# Patient Record
Sex: Female | Born: 1935 | Race: Black or African American | Hispanic: No | Smoking: Never smoker
Health system: Southern US, Community
[De-identification: ages and names within clinical notes are randomized; demographics above are authoritative.]

## PROBLEM LIST (undated history)

## (undated) DIAGNOSIS — F319 Bipolar disorder, unspecified: Secondary | ICD-10-CM

## (undated) DIAGNOSIS — I10 Essential (primary) hypertension: Secondary | ICD-10-CM

## (undated) DIAGNOSIS — F0280 Dementia in other diseases classified elsewhere without behavioral disturbance: Secondary | ICD-10-CM

## (undated) DIAGNOSIS — F0390 Unspecified dementia without behavioral disturbance: Secondary | ICD-10-CM

## (undated) DIAGNOSIS — G309 Alzheimer's disease, unspecified: Secondary | ICD-10-CM

## (undated) HISTORY — PX: CHOLECYSTECTOMY: SHX55

## (undated) HISTORY — PX: ABDOMINAL HYSTERECTOMY: SHX81

---

## 2014-05-01 ENCOUNTER — Emergency Department (HOSPITAL_COMMUNITY): Payer: Medicare Other

## 2014-05-01 ENCOUNTER — Encounter (HOSPITAL_COMMUNITY): Payer: Self-pay | Admitting: Emergency Medicine

## 2014-05-01 ENCOUNTER — Emergency Department (HOSPITAL_COMMUNITY)
Admission: EM | Admit: 2014-05-01 | Discharge: 2014-05-02 | Disposition: A | Discharge status: Home / Self Care | Payer: Medicare Other | Attending: Emergency Medicine | Admitting: Emergency Medicine

## 2014-05-01 DIAGNOSIS — N390 Urinary tract infection, site not specified: Secondary | ICD-10-CM | POA: Insufficient documentation

## 2014-05-01 DIAGNOSIS — F03918 Unspecified dementia, unspecified severity, with other behavioral disturbance: Secondary | ICD-10-CM | POA: Insufficient documentation

## 2014-05-01 DIAGNOSIS — F0391 Unspecified dementia with behavioral disturbance: Secondary | ICD-10-CM | POA: Insufficient documentation

## 2014-05-01 LAB — CBC WITH DIFFERENTIAL/PLATELET
BASOS PCT: 0 % (ref 0–1)
Basophils Absolute: 0 10*3/uL (ref 0.0–0.1)
EOS ABS: 0.1 10*3/uL (ref 0.0–0.7)
EOS PCT: 1 % (ref 0–5)
HCT: 36.8 % (ref 36.0–46.0)
Hemoglobin: 12.1 g/dL (ref 12.0–15.0)
Lymphocytes Relative: 21 % (ref 12–46)
Lymphs Abs: 1.6 10*3/uL (ref 0.7–4.0)
MCH: 31.3 pg (ref 26.0–34.0)
MCHC: 32.9 g/dL (ref 30.0–36.0)
MCV: 95.3 fL (ref 78.0–100.0)
Monocytes Absolute: 1 10*3/uL (ref 0.1–1.0)
Monocytes Relative: 13 % — ABNORMAL HIGH (ref 3–12)
NEUTROS ABS: 5 10*3/uL (ref 1.7–7.7)
Neutrophils Relative %: 65 % (ref 43–77)
Platelets: 223 10*3/uL (ref 150–400)
RBC: 3.86 MIL/uL — ABNORMAL LOW (ref 3.87–5.11)
RDW: 14.4 % (ref 11.5–15.5)
WBC: 7.8 10*3/uL (ref 4.0–10.5)

## 2014-05-01 LAB — URINALYSIS, ROUTINE W REFLEX MICROSCOPIC
Bilirubin Urine: NEGATIVE
Glucose, UA: NEGATIVE mg/dL
Hgb urine dipstick: NEGATIVE
KETONES UR: NEGATIVE mg/dL
NITRITE: NEGATIVE
PH: 7 (ref 5.0–8.0)
Protein, ur: NEGATIVE mg/dL
Specific Gravity, Urine: 1.015 (ref 1.005–1.030)
Urobilinogen, UA: 0.2 mg/dL (ref 0.0–1.0)

## 2014-05-01 LAB — COMPREHENSIVE METABOLIC PANEL
ALK PHOS: 77 U/L (ref 39–117)
ALT: 10 U/L (ref 0–35)
AST: 20 U/L (ref 0–37)
Albumin: 3 g/dL — ABNORMAL LOW (ref 3.5–5.2)
BUN: 18 mg/dL (ref 6–23)
CO2: 28 mEq/L (ref 19–32)
Calcium: 9.2 mg/dL (ref 8.4–10.5)
Chloride: 99 mEq/L (ref 96–112)
Creatinine, Ser: 0.81 mg/dL (ref 0.50–1.10)
GFR calc non Af Amer: 64 mL/min — ABNORMAL LOW (ref >90)
GFR, EST AFRICAN AMERICAN: 75 mL/min — AB (ref >90)
Glucose, Bld: 153 mg/dL — ABNORMAL HIGH (ref 70–99)
Potassium: 4.3 mEq/L (ref 3.7–5.3)
Sodium: 140 mEq/L (ref 137–147)
TOTAL PROTEIN: 6.9 g/dL (ref 6.0–8.3)
Total Bilirubin: <0.2 mg/dL — ABNORMAL LOW (ref 0.3–1.2)

## 2014-05-01 LAB — URINE MICROSCOPIC-ADD ON

## 2014-05-01 LAB — APTT: APTT: 29 s (ref 24–37)

## 2014-05-01 MED ORDER — CEPHALEXIN 250 MG PO CAPS: 250.0000 mg | ORAL_CAPSULE | Freq: Four times a day (QID) | ORAL | Status: DC

## 2014-05-01 MED ORDER — DEXTROSE 5 % IV SOLN
1.0000 g | INTRAVENOUS | Status: DC
  Administered 2014-05-01: 1 g via INTRAVENOUS
  Filled 2014-05-01: qty 10

## 2014-05-01 NOTE — ED Notes (Signed)
Assisted pt with her clothes.  Waiting for [facility] for transport

## 2014-05-01 NOTE — ED Notes (Signed)
Bed: WA20 Expected date:  Expected time:  Means of arrival:  Comments: EMS 80yo F confusion

## 2014-05-01 NOTE — ED Notes (Signed)
Per EMS, [facility]. Nursing home staff state they could not wake pt up, after which they called EMS. EMS arrived and sternal rubbed pt, which awoke the pt. Nursing home staff state pt is more confused than normal. Pt has dementia. Pt is alert and oriented X 4.

## 2014-05-01 NOTE — ED Provider Notes (Signed)
CSN: [id number]     Arrival date & time 05/01/14  1937 History   First MD Initiated Contact with Patient 05/01/14 1939     Chief Complaint  Patient presents with  . Altered Mental Status    HPI Patient presents to the emergency room for evaluation of altered mental status at her nursing home. Nursing home staff went to assess the patient this evening and they were unable to get her to wake up. EMS was contacted. When EMS arrived they performed a brief sternal rub and the patient woke up. Nursing home staff also indicated the patient was more confused than usual today. She does have history of dementia. Here in the emergency room the patient denies having any complaints. She does remember the nursing home staff tried to wake her up. She tells me they were concerned about her so they sent her here to be evaluated. She is not having any trouble with any chest pain, headaches, abdominal pain, fevers, coughing or shortness of breath. Patient states she feels fine in her usual health.  History reviewed. No pertinent past medical history. History reviewed. No pertinent past surgical history. No family history on file. History  Substance Use Topics  . Smoking status: Never Smoker   . Smokeless tobacco: Not on file  . Alcohol Use: No   OB History   Grav Para Term Preterm Abortions TAB SAB Ect Mult Living                 Review of Systems  All other systems reviewed and are negative.     Allergies  Review of patient's allergies indicates no known allergies.  Home Medications   Prior to Admission medications   Not on File   BP 137/62  Pulse 68  Temp(Src) 98.4 F (36.9 C)  Resp 14  SpO2 99% Physical Exam  Nursing note and vitals reviewed. Constitutional: She appears well-developed and well-nourished. No distress.  HENT:  Head: Normocephalic and atraumatic.  Right Ear: External ear normal.  Left Ear: External ear normal.  Eyes: Conjunctivae are normal. Right eye exhibits no  discharge. Left eye exhibits no discharge. No scleral icterus.  Neck: Neck supple. No tracheal deviation present.  Cardiovascular: Normal rate, regular rhythm and intact distal pulses.   Pulmonary/Chest: Effort normal and breath sounds normal. No stridor. No respiratory distress. She has no wheezes. She has no rales.  Abdominal: Soft. Bowel sounds are normal. She exhibits no distension. There is no tenderness. There is no rebound and no guarding.  Musculoskeletal: She exhibits no edema and no tenderness.  Neurological: She is alert. She has normal strength. She is not disoriented. No cranial nerve deficit (no facial droop, extraocular movements intact, no slurred speech) or sensory deficit. She exhibits normal muscle tone. She displays no seizure activity. Coordination normal.  Oriented to person and place,   Skin: Skin is warm and dry. No rash noted.  Psychiatric: She has a normal mood and affect.    ED Course  Procedures (including critical care time) Labs Review Labs Reviewed  CBC WITH DIFFERENTIAL - Abnormal; Notable for the following:    RBC 3.86 (*)    Monocytes Relative 13 (*)    All other components within normal limits  COMPREHENSIVE METABOLIC PANEL - Abnormal; Notable for the following:    Glucose, Bld 153 (*)    Albumin 3.0 (*)    Total Bilirubin <0.2 (*)    GFR calc non Af Amer 64 (*)    GFR calc  Af Amer 75 (*)    All other components within normal limits  URINALYSIS, ROUTINE W REFLEX MICROSCOPIC - Abnormal; Notable for the following:    Leukocytes, UA MODERATE (*)    All other components within normal limits  APTT  URINE MICROSCOPIC-ADD ON    Imaging Review Ct Head Wo Contrast  05/01/2014   CLINICAL DATA:  Status post seizure.  Fall.  EXAM: CT HEAD WITHOUT CONTRAST  TECHNIQUE: Contiguous axial images were obtained from the base of the skull through the vertex without intravenous contrast.  COMPARISON:  None.  FINDINGS: There is chronic microvascular ischemic change.  Partially calcified mass in the posterior cranial fossa on the left measures 1.4 x 1.7 cm. The lesion cannot be definitively characterized but likely represents a meningioma. No other mass identified. There is no evidence of acute infarction, hemorrhage, midline shift or abnormal extra-axial fluid collection. No hydrocephalus or pneumocephalus. The calvarium is intact. Empty sella turcica is noted incidentally.  IMPRESSION: No acute abnormality.  Chronic microvascular ischemic change.  Calcified mass lesion the posterior cranial fossa cannot be definitively characterized but likely represents a meningioma.   Electronically Signed   By: Drusilla Kannerhomas  Dalessio M.D.   On: 05/01/2014 20:55     MDM   Final diagnoses:  UTI (urinary tract infection)    Pt remains in no distress in the ED.  UTI noted on UA.  Dose of abx given while in the ED.   Stable for discharge back to nursing home.  Keflex rx   Celene KrasJon R Jaydalynn Olivero, MD 05/01/14 2224

## 2014-05-01 NOTE — ED Notes (Signed)
[facility] CALLED  °

## 2014-05-01 NOTE — Discharge Instructions (Signed)
Urinary Tract Infection °A urinary tract infection (UTI) can occur any place along the urinary tract. The tract includes the kidneys, ureters, bladder, and urethra. A type of germ called bacteria often causes a UTI. UTIs are often helped with antibiotic medicine.  °HOME CARE  °· If given, take antibiotics as told by your doctor. Finish them even if you start to feel better. °· Drink enough fluids to keep your pee (urine) clear or pale yellow. °· Avoid tea, drinks with caffeine, and bubbly (carbonated) drinks. °· Pee often. Avoid holding your pee in for a long time. °· Pee before and after having sex (intercourse). °· Wipe from front to back after you poop (bowel movement) if you are a woman. Use each tissue only once. °GET HELP RIGHT AWAY IF:  °· You have back pain. °· You have lower belly (abdominal) pain. °· You have chills. °· You feel sick to your stomach (nauseous). °· You throw up (vomit). °· Your burning or discomfort with peeing does not go away. °· You have a fever. °· Your symptoms are not better in 3 days. °MAKE SURE YOU:  °· Understand these instructions. °· Will watch your condition. °· Will get help right away if you are not doing well or get worse. °Document Released: 05/24/2008 Document Revised: 08/30/2012 Document Reviewed: 07/06/2012 °ExitCare® Patient Information ©2014 [employer]. ° °

## 2014-05-01 NOTE — Progress Notes (Signed)
  CARE MANAGEMENT ED NOTE 05/01/2014  Patient:  Horatio PelGREEN,Fronnie   Account Number:  [id number]  Date Initiated:  05/01/2014  Documentation initiated by:  Radford PaxFERRERO,Mily Malecki  Subjective/Objective Assessment:   Patient presents to Ed with increased confusion and difficult to arouse.     Subjective/Objective Assessment Detail:     Action/Plan:   Action/Plan Detail:   Anticipated DC Date:  05/01/2014     Status Recommendation to Physician:   Result of Recommendation:    Other ED Services  Consult Working Plan    DC Planning Services  Other  PCP issues    Choice offered to / List presented to:            Status of service:  Completed, signed off  ED Comments:   ED Comments Detail:  As per patient's chart via EMS, [facility] is Dr. Danise MinaBarbara Thomas-Lowry.  System updated.

## 2014-05-01 NOTE — ED Notes (Signed)
[facility]. Staff state pt was not responsive for approximately 10 minutes.

## 2014-05-16 ENCOUNTER — Encounter (HOSPITAL_COMMUNITY): Payer: Self-pay | Admitting: Emergency Medicine

## 2014-05-16 ENCOUNTER — Emergency Department (HOSPITAL_COMMUNITY)
Admission: EM | Admit: 2014-05-16 | Discharge: 2014-05-17 | Disposition: A | Discharge status: Other Acute Inpatient Hospital | Payer: Medicare Other | Attending: Emergency Medicine | Admitting: Emergency Medicine

## 2014-05-16 DIAGNOSIS — F0280 Dementia in other diseases classified elsewhere without behavioral disturbance: Secondary | ICD-10-CM | POA: Diagnosis not present

## 2014-05-16 DIAGNOSIS — Z79899 Other long term (current) drug therapy: Secondary | ICD-10-CM | POA: Diagnosis not present

## 2014-05-16 DIAGNOSIS — G309 Alzheimer's disease, unspecified: Secondary | ICD-10-CM | POA: Diagnosis not present

## 2014-05-16 DIAGNOSIS — F411 Generalized anxiety disorder: Secondary | ICD-10-CM | POA: Insufficient documentation

## 2014-05-16 DIAGNOSIS — F3010 Manic episode without psychotic symptoms, unspecified: Secondary | ICD-10-CM

## 2014-05-16 DIAGNOSIS — F309 Manic episode, unspecified: Secondary | ICD-10-CM | POA: Diagnosis not present

## 2014-05-16 DIAGNOSIS — I10 Essential (primary) hypertension: Secondary | ICD-10-CM | POA: Insufficient documentation

## 2014-05-16 DIAGNOSIS — IMO0002 Reserved for concepts with insufficient information to code with codable children: Secondary | ICD-10-CM | POA: Insufficient documentation

## 2014-05-16 DIAGNOSIS — F319 Bipolar disorder, unspecified: Secondary | ICD-10-CM

## 2014-05-16 DIAGNOSIS — F909 Attention-deficit hyperactivity disorder, unspecified type: Secondary | ICD-10-CM | POA: Insufficient documentation

## 2014-05-16 DIAGNOSIS — R4182 Altered mental status, unspecified: Secondary | ICD-10-CM | POA: Diagnosis present

## 2014-05-16 DIAGNOSIS — Z792 Long term (current) use of antibiotics: Secondary | ICD-10-CM | POA: Diagnosis not present

## 2014-05-16 HISTORY — DX: Unspecified dementia, unspecified severity, without behavioral disturbance, psychotic disturbance, mood disturbance, and anxiety: F03.90

## 2014-05-16 HISTORY — DX: Dementia in other diseases classified elsewhere, unspecified severity, without behavioral disturbance, psychotic disturbance, mood disturbance, and anxiety: F02.80

## 2014-05-16 HISTORY — DX: Bipolar disorder, unspecified: F31.9

## 2014-05-16 HISTORY — DX: Alzheimer's disease, unspecified: G30.9

## 2014-05-16 HISTORY — DX: Essential (primary) hypertension: I10

## 2014-05-16 LAB — RAPID URINE DRUG SCREEN, HOSP PERFORMED
AMPHETAMINES: NOT DETECTED
BENZODIAZEPINES: NOT DETECTED
Barbiturates: NOT DETECTED
COCAINE: NOT DETECTED
OPIATES: NOT DETECTED
Tetrahydrocannabinol: NOT DETECTED

## 2014-05-16 LAB — COMPREHENSIVE METABOLIC PANEL
ALBUMIN: 3.3 g/dL — AB (ref 3.5–5.2)
ALK PHOS: 66 U/L (ref 39–117)
ALT: 14 U/L (ref 0–35)
AST: 20 U/L (ref 0–37)
BUN: 16 mg/dL (ref 6–23)
CO2: 24 mEq/L (ref 19–32)
Calcium: 9.1 mg/dL (ref 8.4–10.5)
Chloride: 108 mEq/L (ref 96–112)
Creatinine, Ser: 1.23 mg/dL — ABNORMAL HIGH (ref 0.50–1.10)
GFR calc Af Amer: 45 mL/min — ABNORMAL LOW (ref >90)
GFR calc non Af Amer: 39 mL/min — ABNORMAL LOW (ref >90)
Glucose, Bld: 142 mg/dL — ABNORMAL HIGH (ref 70–99)
POTASSIUM: 3.6 meq/L — AB (ref 3.7–5.3)
Sodium: 145 mEq/L (ref 137–147)
TOTAL PROTEIN: 6.4 g/dL (ref 6.0–8.3)
Total Bilirubin: <0.2 mg/dL — ABNORMAL LOW (ref 0.3–1.2)

## 2014-05-16 LAB — VALPROIC ACID LEVEL: Valproic Acid Lvl: <10 ug/mL — ABNORMAL LOW (ref 50.0–100.0)

## 2014-05-16 LAB — URINALYSIS, ROUTINE W REFLEX MICROSCOPIC
GLUCOSE, UA: NEGATIVE mg/dL
HGB URINE DIPSTICK: NEGATIVE
Ketones, ur: NEGATIVE mg/dL
Leukocytes, UA: NEGATIVE
Nitrite: NEGATIVE
PH: 5 (ref 5.0–8.0)
Protein, ur: 30 mg/dL — AB
Specific Gravity, Urine: 1.021 (ref 1.005–1.030)
Urobilinogen, UA: 0.2 mg/dL (ref 0.0–1.0)

## 2014-05-16 LAB — CBC
HEMATOCRIT: 34.8 % — AB (ref 36.0–46.0)
Hemoglobin: 11.4 g/dL — ABNORMAL LOW (ref 12.0–15.0)
MCH: 31.4 pg (ref 26.0–34.0)
MCHC: 32.8 g/dL (ref 30.0–36.0)
MCV: 95.9 fL (ref 78.0–100.0)
PLATELETS: 274 10*3/uL (ref 150–400)
RBC: 3.63 MIL/uL — ABNORMAL LOW (ref 3.87–5.11)
RDW: 14.9 % (ref 11.5–15.5)
WBC: 6.3 10*3/uL (ref 4.0–10.5)

## 2014-05-16 LAB — URINE MICROSCOPIC-ADD ON

## 2014-05-16 LAB — CBG MONITORING, ED: Glucose-Capillary: 131 mg/dL — ABNORMAL HIGH (ref 70–99)

## 2014-05-16 LAB — ETHANOL: Alcohol, Ethyl (B): <11 mg/dL (ref 0–11)

## 2014-05-16 MED ORDER — MEMANTINE HCL ER 7 MG PO CP24
28.0000 mg | ORAL_CAPSULE | Freq: Every day | ORAL | Status: DC
  Filled 2014-05-16: qty 4

## 2014-05-16 MED ORDER — AMLODIPINE BESYLATE 10 MG PO TABS
10.0000 mg | ORAL_TABLET | Freq: Every day | ORAL | Status: DC
  Administered 2014-05-17: 10 mg via ORAL
  Filled 2014-05-16: qty 1

## 2014-05-16 MED ORDER — LORAZEPAM 2 MG/ML IJ SOLN: 1.0000 mg | INTRAMUSCULAR | Status: DC | PRN

## 2014-05-16 MED ORDER — LORAZEPAM 0.5 MG PO TABS
0.2500 mg | ORAL_TABLET | Freq: Two times a day (BID) | ORAL | Status: DC | PRN
  Administered 2014-05-17: 0.25 mg via ORAL
  Filled 2014-05-16: qty 1

## 2014-05-16 MED ORDER — QUETIAPINE FUMARATE 100 MG PO TABS: 100.0000 mg | ORAL_TABLET | Freq: Every day | ORAL | Status: DC

## 2014-05-16 MED ORDER — METOPROLOL TARTRATE 25 MG PO TABS
25.0000 mg | ORAL_TABLET | Freq: Every day | ORAL | Status: DC
  Administered 2014-05-17: 25 mg via ORAL
  Filled 2014-05-16: qty 1

## 2014-05-16 MED ORDER — VENLAFAXINE HCL 37.5 MG PO TABS
37.5000 mg | ORAL_TABLET | ORAL | Status: DC
  Administered 2014-05-17 (×2): 37.5 mg via ORAL
  Filled 2014-05-16 (×4): qty 1

## 2014-05-16 MED ORDER — HALOPERIDOL LACTATE 5 MG/ML IJ SOLN
5.0000 mg | Freq: Once | INTRAMUSCULAR | Status: AC
  Administered 2014-05-16: 5 mg via INTRAMUSCULAR
  Filled 2014-05-16: qty 1

## 2014-05-16 MED ORDER — HYDROCHLOROTHIAZIDE 25 MG PO TABS
25.0000 mg | ORAL_TABLET | Freq: Every day | ORAL | Status: DC
  Administered 2014-05-17: 25 mg via ORAL
  Filled 2014-05-16: qty 1

## 2014-05-16 NOTE — ED Notes (Signed)
Per MAR from SNF-pt has been refusing to take all medications

## 2014-05-16 NOTE — ED Notes (Signed)
Pt is here for medical clearance

## 2014-05-16 NOTE — ED Notes (Signed)
Dr. James at bedside  
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
## 2014-05-16 NOTE — ED Notes (Signed)
Notified RN, Merle pt. CBG 131.

## 2014-05-17 ENCOUNTER — Emergency Department (HOSPITAL_COMMUNITY): Payer: Medicare Other

## 2014-05-17 DIAGNOSIS — F309 Manic episode, unspecified: Secondary | ICD-10-CM | POA: Diagnosis not present

## 2014-05-17 DIAGNOSIS — F29 Unspecified psychosis not due to a substance or known physiological condition: Secondary | ICD-10-CM

## 2014-05-17 MED ORDER — HALOPERIDOL LACTATE 5 MG/ML IJ SOLN
5.0000 mg | Freq: Once | INTRAMUSCULAR | Status: AC
  Administered 2014-05-17: 5 mg via INTRAMUSCULAR
  Filled 2014-05-17: qty 1

## 2014-05-17 MED ORDER — DIVALPROEX SODIUM 125 MG PO CPSP
250.0000 mg | ORAL_CAPSULE | Freq: Four times a day (QID) | ORAL | Status: DC
  Administered 2014-05-17 (×2): 250 mg via ORAL
  Filled 2014-05-17 (×4): qty 2

## 2014-05-17 NOTE — ED Notes (Signed)
Report received from [place], [place]  Pt has been accepted at [facility] and will be transported tomorrow.

## 2014-05-17 NOTE — Progress Notes (Signed)
Referred to:   [facility] pending review.   At [facility], [place] [phone]  ED CSW 05/17/2014 1446pm

## 2014-05-17 NOTE — ED Notes (Signed)
Pt took pill with applesauce.
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
## 2014-05-17 NOTE — Consult Note (Signed)
Jenny Patterson Face-to-Face Psychiatry Consult   Reason for Consult:  Psychosis NOS Referring Physician:  EDP  Jenny Patterson is an 80 y.o. female. Total Time spent with patient: 20 minutes  Assessment: AXIS I:  Psychotic Disorder NOS AXIS II:  Deferred AXIS III:   Past Medical History  Diagnosis Date  . Alzheimer disease   . Dementia   . Bipolar 1 disorder   . Hypertension    AXIS IV:  other psychosocial or environmental problems, problems related to social environment and problems with primary support group AXIS V:  11-20 some danger of hurting self or others possible OR occasionally fails to maintain minimal personal hygiene OR gross impairment in communication  Plan:  Recommend psychiatric Inpatient admission when medically cleared.  Subjective:   Jenny Patterson is a 80 y.o. female patient admitted with change in her mental status.Marland Kitchen  HPI:  Patient seen and chart reviewed.  Patient is 80 year old female who is brought in by [facility] because she was manic, loud disruptive and threatening to kill herself and other people.  She was refusing to take medication.  Patient was given Haldol in the emergency room.  Patient remains very disorganized, manic, irritable and disruptive.  Her speech is very fast and incoherent.  She continued to endorse that people does not like her and she appears paranoid.  She was upset because she was unable to see the daughter.  She is in a denial that she has any psychiatric problem.  Patient is unable to provide any relevant information.  As per chart she is taking Depakote however her level is low.  Patient appears any manic.  She is on Seroquel and venlafaxine.  She denies any hallucination however she appears very paranoid, illogical, irrational and disruptive.  Patient requires inpatient psychiatric treatment.   Past Psychiatric History: Past Medical History  Diagnosis Date  . Alzheimer disease   . Dementia   . Bipolar 1 disorder   . Hypertension     reports  that she has never smoked. She does not have any smokeless tobacco history on file. She reports that she does not drink alcohol or use illicit drugs. No family history on file. Family History Substance Abuse: No Family Supports: Yes, List: (Daughter in [place].  Although she is sick.) Living Arrangements: Other (Comment) (Assisted living facility, [place]) Can pt return to current living arrangement?:  (Unsure.  Could not get ayone at the number.) Abuse/Neglect [facility]) Physical Abuse: Denies Verbal Abuse: Denies Sexual Abuse: Denies Allergies:  No Known Allergies  ACT Assessment Complete:  Yes:    Educational Status    Risk to Self: Risk to self Suicidal Ideation: No Suicidal Intent: No Is patient at risk for suicide?: No Suicidal Plan?: No Access to Means: No What has been your use of drugs/alcohol within the last 12 months?: Denies use Previous Attempts/Gestures: No How many times?: 0 Other Self Harm Risks: None Triggers for Past Attempts: None known Intentional Self Injurious Behavior: None Family Suicide History: Unknown Recent stressful life event(s): Other (Comment) (Pt did not identify a specific thing.) Persecutory voices/beliefs?: Yes Depression: No Depression Symptoms:  (Denies depressive symptoms) Substance abuse history and/or treatment for substance abuse?: No Suicide prevention information given to non-admitted patients: Not applicable  Risk to Others: Risk to Others Homicidal Ideation: No Thoughts of Harm to Others: No Current Homicidal Intent: No Current Homicidal Plan: No Access to Homicidal Means: No Identified Victim: No one History of harm to others?: No Assessment of Violence:  (Resportedly  physically aggressive at facility.  Pt denies.) Violent Behavior Description: Pt denies this behavior. Does patient have access to weapons?: No Criminal Charges Pending?: No Does patient have a court date: No  Abuse: Abuse/Neglect Assessment  (Assessment to be complete while patient is alone) Physical Abuse: Denies Verbal Abuse: Denies Sexual Abuse: Denies Exploitation of patient/patient's resources: Denies Self-Neglect: Denies  Prior Inpatient Therapy: Prior Inpatient Therapy Prior Inpatient Therapy: Yes Prior Therapy Dates: Unknown Prior Therapy Facilty/Provider(s): Several commitmnts in past Reason for Treatment: Mania  Prior Outpatient Therapy: Prior Outpatient Therapy Prior Outpatient Therapy: No Prior Therapy Dates: N/a Prior Therapy Facilty/Provider(s): N/A Reason for Treatment: N/A  Additional Information: Additional Information 1:1 In Past 12 Months?: No CIRT Risk: No Elopement Risk: No Does patient have medical clearance?: Yes                  Objective: Blood pressure 166/79, pulse 66, temperature 97.4 F (36.3 C), temperature source Oral, resp. rate 16, SpO2 98.00%.There is no height or weight on file to calculate BMI. Results for orders placed during the Patterson encounter of 05/16/14 (from the past 72 hour(s))  CBC     Status: Abnormal   Collection Time    05/16/14  8:25 PM      Result Value Ref Range   WBC 6.3  4.0 - 10.5 K/uL   RBC 3.63 (*) 3.87 - 5.11 MIL/uL   Hemoglobin 11.4 (*) 12.0 - 15.0 g/dL   HCT 34.8 (*) 36.0 - 46.0 %   MCV 95.9  78.0 - 100.0 fL   MCH 31.4  26.0 - 34.0 pg   MCHC 32.8  30.0 - 36.0 g/dL   RDW 14.9  11.5 - 15.5 %   Platelets 274  150 - 400 K/uL  COMPREHENSIVE METABOLIC PANEL     Status: Abnormal   Collection Time    05/16/14  8:25 PM      Result Value Ref Range   Sodium 145  137 - 147 mEq/L   Potassium 3.6 (*) 3.7 - 5.3 mEq/L   Chloride 108  96 - 112 mEq/L   CO2 24  19 - 32 mEq/L   Glucose, Bld 142 (*) 70 - 99 mg/dL   BUN 16  6 - 23 mg/dL   Creatinine, Ser 1.23 (*) 0.50 - 1.10 mg/dL   Calcium 9.1  8.4 - 10.5 mg/dL   Total Protein 6.4  6.0 - 8.3 g/dL   Albumin 3.3 (*) 3.5 - 5.2 g/dL   AST 20  0 - 37 U/L   ALT 14  0 - 35 U/L   Alkaline Phosphatase 66   39 - 117 U/L   Total Bilirubin <0.2 (*) 0.3 - 1.2 mg/dL   GFR calc non Af Amer 39 (*) >90 mL/min   GFR calc Af Amer 45 (*) >90 mL/min   Comment: (NOTE)     The eGFR has been calculated using the CKD EPI equation.     This calculation has not been validated in all clinical situations.     eGFR's persistently <90 mL/min signify possible Chronic Kidney     Disease.  ETHANOL     Status: None   Collection Time    05/16/14  8:25 PM      Result Value Ref Range   Alcohol, Ethyl (B) <11  0 - 11 mg/dL   Comment:            LOWEST DETECTABLE LIMIT FOR     SERUM ALCOHOL IS 11  mg/dL     FOR MEDICAL PURPOSES ONLY  CBG MONITORING, ED     Status: Abnormal   Collection Time    05/16/14  8:30 PM      Result Value Ref Range   Glucose-Capillary 131 (*) 70 - 99 mg/dL  URINALYSIS, ROUTINE W REFLEX MICROSCOPIC     Status: Abnormal   Collection Time    05/16/14  8:48 PM      Result Value Ref Range   Color, Urine YELLOW  YELLOW   APPearance CLOUDY (*) CLEAR   Specific Gravity, Urine 1.021  1.005 - 1.030   pH 5.0  5.0 - 8.0   Glucose, UA NEGATIVE  NEGATIVE mg/dL   Hgb urine dipstick NEGATIVE  NEGATIVE   Bilirubin Urine SMALL (*) NEGATIVE   Ketones, ur NEGATIVE  NEGATIVE mg/dL   Protein, ur 30 (*) NEGATIVE mg/dL   Urobilinogen, UA 0.2  0.0 - 1.0 mg/dL   Nitrite NEGATIVE  NEGATIVE   Leukocytes, UA NEGATIVE  NEGATIVE  URINE RAPID DRUG SCREEN (HOSP PERFORMED)     Status: None   Collection Time    05/16/14  8:48 PM      Result Value Ref Range   Opiates NONE DETECTED  NONE DETECTED   Cocaine NONE DETECTED  NONE DETECTED   Benzodiazepines NONE DETECTED  NONE DETECTED   Amphetamines NONE DETECTED  NONE DETECTED   Tetrahydrocannabinol NONE DETECTED  NONE DETECTED   Barbiturates NONE DETECTED  NONE DETECTED   Comment:            DRUG SCREEN FOR MEDICAL PURPOSES     ONLY.  IF CONFIRMATION IS NEEDED     FOR ANY PURPOSE, NOTIFY LAB     WITHIN 5 DAYS.                LOWEST DETECTABLE LIMITS     FOR  URINE DRUG SCREEN     Drug Class       Cutoff (ng/mL)     Amphetamine      1000     Barbiturate      200     Benzodiazepine   413     Tricyclics       244     Opiates          300     Cocaine          300     THC              50  URINE MICROSCOPIC-ADD ON     Status: Abnormal   Collection Time    05/16/14  8:48 PM      Result Value Ref Range   Squamous Epithelial / LPF RARE  RARE   WBC, UA 0-2  <3 WBC/hpf   Casts HYALINE CASTS (*) NEGATIVE   Urine-Other AMORPHOUS URATES/PHOSPHATES     Comment: MUCOUS PRESENT  VALPROIC ACID LEVEL     Status: Abnormal   Collection Time    05/16/14  8:51 PM      Result Value Ref Range   Valproic Acid Lvl <10.0 (*) 50.0 - 100.0 ug/mL   Comment: Performed at [facility] are reviewed.  Current Facility-Administered Medications  Medication Dose Route Frequency Provider Last Rate Last Dose  . amLODipine (NORVASC) tablet 10 mg  10 mg Oral Daily Tanna Furry, MD   10 mg at 05/17/14 1002  . divalproex (DEPAKOTE SPRINKLE) capsule 250 mg  250 mg Oral QID  Kristeen Mans, NP   250 mg at 05/17/14 1004  . hydrochlorothiazide (HYDRODIURIL) tablet 25 mg  25 mg Oral Daily Rolland Porter, MD   25 mg at 05/17/14 1001  . LORazepam (ATIVAN) injection 1 mg  1 mg Intramuscular Q4H PRN Rolland Porter, MD      . LORazepam (ATIVAN) tablet 0.25 mg  0.25 mg Oral Q12H PRN Rolland Porter, MD      . Memantine HCl ER CP24 28 mg  28 mg Oral Daily Rolland Porter, MD      . metoprolol tartrate (LOPRESSOR) tablet 25 mg  25 mg Oral Daily Rolland Porter, MD   25 mg at 05/17/14 1001  . QUEtiapine (SEROQUEL) tablet 100 mg  100 mg Oral QHS Rolland Porter, MD      . venlafaxine [facility]) tablet 37.5 mg  37.5 mg Oral 3 times per day Rolland Porter, MD   37.5 mg at 05/17/14 7510   Current Outpatient Prescriptions  Medication Sig Dispense Refill  . amLODipine (NORVASC) 10 MG tablet Take 10 mg by mouth daily.      . divalproex (DEPAKOTE SPRINKLE) 125 MG capsule Take 250 mg by mouth 4 (four) times daily.       Marland Kitchen guaifenesin (ROBITUSSIN) 100 MG/5ML syrup Take 200 mg by mouth every 6 (six) hours as needed for cough.      . hydrochlorothiazide (HYDRODIURIL) 25 MG tablet Take 25 mg by mouth daily.      Marland Kitchen loperamide (IMODIUM) 2 MG capsule Take 2 mg by mouth as needed for diarrhea or loose stools.      Marland Kitchen LORazepam (ATIVAN) 0.5 MG tablet Take 0.25 mg by mouth every 12 (twelve) hours as needed for anxiety.      . magnesium hydroxide (MILK OF MAGNESIA) 400 MG/5ML suspension Take 30 mLs by mouth at bedtime as needed for mild constipation.      . Memantine HCl ER (NAMENDA XR) 28 MG CP24 Take 28 mg by mouth daily.      . metoprolol tartrate (LOPRESSOR) 25 MG tablet Take 25 mg by mouth daily.      . QUEtiapine (SEROQUEL) 100 MG tablet Take 100 mg by mouth at bedtime.      Marland Kitchen venlafaxine (EFFEXOR) 37.5 MG tablet Take 37.5 mg by mouth See admin instructions. With breakfast, supper and at bedtime      . cephALEXin (KEFLEX) 250 MG capsule Take 1 capsule (250 mg total) by mouth 4 (four) times daily.  28 capsule  0  . ciprofloxacin (CIPRO) 500 MG tablet Take 500 mg by mouth every 12 (twelve) hours.        Psychiatric Specialty Exam:     Blood pressure 166/79, pulse 66, temperature 97.4 F (36.3 C), temperature source Oral, resp. rate 16, SpO2 98.00%.There is no height or weight on file to calculate BMI.  General Appearance: Guarded  Eye Contact::  Poor  Speech:  Pressured and Fast  Volume:  Increased  Mood:  Irritable  Affect:  Inappropriate and Labile  Thought Process:  Disorganized  Orientation:  Other:  Confused  Thought Content:  Delusions and Paranoid Ideation  Suicidal Thoughts:  No  Homicidal Thoughts:  Yes.  without intent/plan patient threatened to hit other people.    Memory:  poor  Judgement:  Impaired  Insight:  Shallow  Psychomotor Activity:  Increased and Restlessness  Concentration:  Poor  Recall:  Poor  Fund of Knowledge:Fair  Language: Fair  Akathisia:  No  Handed:  Right  AIMS (if  indicated):     Assets:  Housing  Sleep:      Musculoskeletal: Strength & Muscle Tone: Unable to assess Gait & Station: Patient is lying on the bed Patient leans: N/A  Treatment Plan Summary: The patient requires inpatient psychiatric treatment., she does require Gero-Psychiatric bed.  Continue current medication until we find the bed and if she is medically cleared transfer to psychiatric hospitalization.  Dossie Der T Anayeli Arel 05/17/2014 11:21 AM

## 2014-05-17 NOTE — Progress Notes (Addendum)
Patient requested that CSW contact a Sherlynn Stalls [phone] she said was her doctor.  CSW called the number and it was a Engineer, technical sales for [employer].  CSW informed the patient of the contact.      Maryelizabeth Rowan, MSW, [place], 05/17/2014 Evening Clinical Social Worker [phone]

## 2014-05-17 NOTE — ED Notes (Signed)
Deputy has arrived to transport pt to [facility]. Zigmund Daniel at [place] notified

## 2014-05-17 NOTE — Progress Notes (Signed)
Pt recommended for inpatient treatment.   Byrd Hesselbach [phone]  ED CSW 05/17/2014 1110am

## 2014-05-17 NOTE — ED Notes (Signed)
Pt has white jogging pants,  White shirt,  Purple cotton jacket ,  Black shoes ,  White socks.   In a belongings bag

## 2014-05-17 NOTE — ED Notes (Signed)
Pt was pacing floor and stating she wanted her clothes and demanding that she get her things from the facility she is at.  Pt redirected and guided gently back to room with sitter at bedside

## 2014-05-17 NOTE — ED Notes (Signed)
Report called to [facility], per Social Work, Deputy has been contacted for transport

## 2014-05-17 NOTE — Progress Notes (Signed)
CSW faxed all labs and IVC paperwork requested by Delorise Shiner at [facility].  She said she will call back once the information is received and give the accepting physician.     Maryelizabeth Rowan, MSW, [place], 05/17/2014 Evening Clinical Social Worker [phone]

## 2014-05-17 NOTE — ED Notes (Signed)
TTS at bedside. 

## 2014-05-17 NOTE — ED Notes (Signed)
Spoke with dispatch, they have no record of call for transport. Transport requested, deputy to respond asap.

## 2014-05-17 NOTE — ED Notes (Signed)
Pt ambulated to bathroom with minimal assistance from [place] NT  Pt is alert and oriented to self and place,  Pt has been cooperative with staff

## 2014-05-17 NOTE — ED Notes (Signed)
Per [place] RN  Pt refused her depakote due to fact she said it didn;t look like her past medication, this writer will try again shortly to give medication

## 2014-05-17 NOTE — ED Notes (Signed)
Per [facility], they do not do transports at night, pt will be transported in the AM Pt now walking around room, very agitated about her belongings at [facility]. Pt will have safety sitter at 2300

## 2014-05-17 NOTE — Progress Notes (Signed)
CSW received a call from [place] with [facility] accepting the patient per Dr. Baldo Daub. Report#[phone]. CSW informed the nursing staff of the updated disposition and provided all information to complete this transfer.      Maryelizabeth Rowan, MSW, [place], 05/17/2014 Evening Clinical Social Worker [phone]

## 2014-05-17 NOTE — BH Assessment (Signed)
Assessment Note  Jenny Patterson is an 80 y.o. female.  -Clinician reviewed the note by Dr. Fayrene Fearing.  Patient was IVC'ed by Child psychotherapist at [facility].  Reportedly has been manic, loud & disruptive.  Had threatened to kill herself if she has to go to a doctor or takes meds.  Facility has recorded a lot of medication refusals.  Patient was given haldol a few hours before this clinician saw her.  Patient did arouse easily.  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  She talks about how she prays with other residents at [facility].  Patient does deny SI, HI or A/V hallucinations.  Patient says that she does not need other medications besides her UTI medications.  She reports that the other medications made her feel sick and that she stopped taking them because of this.  She says that when she stopped, she got to feeling better.  Patient questioned why she needed to be seen by the psychiatrist later in the AM.  She says, "there is nothing wrong with me mentally."  Patient has a 52 year old daughter who has cancer and lives in [place].  Patient would like to be able to visit this daughter.  -Clinician did try to call [facility] but did not get anyone in their office.  Clinician talked with Alberteen Sam, NP who recommended that patient be seen by psychiatry in AM on 05/29 to uphold or rescind IVC.  Clinician talked with Dr. Ranae Palms, who agreed with this.  First opinion has not been completed, pending psychiatric review of IVC.  Axis I: Bipolar, Manic Axis II: Deferred Axis III:  Past Medical History  Diagnosis Date  . Alzheimer disease   . Dementia   . Bipolar 1 disorder   . Hypertension    Axis IV: other psychosocial or environmental problems Axis V: 31-40 impairment in reality testing  Past Medical History:  Past Medical History  Diagnosis Date  . Alzheimer disease   . Dementia   . Bipolar 1  disorder   . Hypertension     Past Surgical History  Procedure Laterality Date  . Cholecystectomy    . Abdominal hysterectomy      partial    Family History: No family history on file.  Social History:  reports that she has never smoked. She does not have any smokeless tobacco history on file. She reports that she does not drink alcohol or use illicit drugs.  Additional Social History:  Alcohol / Drug Use Pain Medications: See PTA medications list Prescriptions: See PTA medications list Over the Counter: See PTA medications list History of alcohol / drug use?: No history of alcohol / drug abuse  CIWA: CIWA-Ar BP: 144/84 mmHg Pulse Rate: 74 COWS:    Allergies: No Known Allergies  Home Medications:  (Not in a hospital admission)  OB/GYN Status:  No LMP recorded. Patient is postmenopausal.  General Assessment Data Location of Assessment: [facility] ED Is this a Tele or Face-to-Face Assessment?: Face-to-Face Is this an Initial Assessment or a Re-assessment for this encounter?: Initial Assessment Living Arrangements: Other (Comment) (Assisted living facility, [facility]) Can pt return to current living arrangement?:  (Unsure.  Could not get ayone at the number.) Admission Status: Involuntary Is patient capable of signing voluntary admission?: No Transfer from: Acute Hospital Referral Source: Other (Social worker at nursing facility took out [employer].)  [facility] Living Arrangements: Other (Comment) ([facility] Name of Psychiatrist: None Name of Therapist: None     Risk to self Suicidal Ideation: No Suicidal Intent: No Is patient at risk for suicide?: No Suicidal Plan?: No Access to Means: No What has been your use of drugs/alcohol within the last 12 months?: Denies use Previous Attempts/Gestures: No How many times?: 0 Other Self Harm Risks: None Triggers for Past Attempts: None known Intentional Self Injurious  Behavior: None Family Suicide History: Unknown Recent stressful life event(s): Other (Comment) (Pt did not identify a specific thing.) Persecutory voices/beliefs?: Yes Depression: No Depression Symptoms:  (Denies depressive symptoms) Substance abuse history and/or treatment for substance abuse?: No Suicide prevention information given to non-admitted patients: Not applicable  Risk to Others Homicidal Ideation: No Thoughts of Harm to Others: No Current Homicidal Intent: No Current Homicidal Plan: No Access to Homicidal Means: No Identified Victim: No one History of harm to others?: No Assessment of Violence:  (Resportedly physically aggressive at facility.  Pt denies.) Violent Behavior Description: Pt denies this behavior. Does patient have access to weapons?: No Criminal Charges Pending?: No Does patient have a court date: No  Psychosis Hallucinations: None noted Delusions: None noted  Mental Status Report Appear/Hygiene: Disheveled Eye Contact: Good Motor Activity: Freedom of movement;Unsteady Speech: Logical/coherent Level of Consciousness: Quiet/awake Mood: Apprehensive Affect: Appropriate to circumstance Anxiety Level: Minimal Thought Processes: Coherent;Relevant Judgement: Unimpaired Orientation: Person;Time;Situation Obsessive Compulsive Thoughts/Behaviors: Moderate  Cognitive Functioning Concentration: Decreased Memory: Recent Impaired;Remote Intact IQ: Average Insight: Good Impulse Control: Fair Appetite: Fair Weight Loss: 0 Weight Gain: 0 Sleep: No Change Total Hours of Sleep: 8 Vegetative Symptoms: None  ADLScreening [facility] Assessment [facility]) Patient's cognitive ability adequate to safely complete daily activities?: Yes Patient able to express need for assistance with ADLs?: Yes Independently performs ADLs?: Yes (appropriate for developmental age)  Prior Inpatient Therapy Prior Inpatient Therapy: Yes Prior Therapy Dates: Unknown Prior Therapy  Facilty/Provider(s): Several commitmnts in past Reason for Treatment: Mania  Prior Outpatient Therapy Prior Outpatient Therapy: No Prior Therapy Dates: N/a Prior Therapy Facilty/Provider(s): N/A Reason for Treatment: N/A  ADL Screening (condition at time of admission) Patient's cognitive ability adequate to safely complete daily activities?: Yes Is the patient deaf or have difficulty hearing?: No Does the patient have difficulty seeing, even when wearing glasses/contacts?: No Does the patient have difficulty concentrating, remembering, or making decisions?: Yes Patient able to express need for assistance with ADLs?: Yes Does the patient have difficulty dressing or bathing?: No Independently performs ADLs?: Yes (appropriate for developmental age) Does the patient have difficulty walking or climbing stairs?: Yes (Reports that she has had to use a walker in the past.) Weakness of Legs: Both Weakness of Arms/Hands: None       Abuse/Neglect Assessment (Assessment to be complete while patient is alone) Physical Abuse: Denies Verbal Abuse: Denies Sexual Abuse: Denies Exploitation of patient/patient's resources: Denies Self-Neglect: Denies Values / Beliefs Cultural Requests During Hospitalization: None Spiritual Requests During Hospitalization: None   Advance Directives (For Healthcare) Advance Directive: Patient does not have advance directive;Patient would not like information Pre-existing out of facility DNR order (yellow form or pink MOST form): No    Additional Information 1:1 In Past 12 Months?: No CIRT Risk: No Elopement Risk: No Does patient have medical clearance?: Yes     Disposition:  Disposition Initial Assessment Completed for this Encounter: Yes Disposition of Patient: Other dispositions Other disposition(s):  (To be seen by psychiatrist in AM.)  On Site Evaluation by:   Reviewed with Physician:    Bubba Camp 05/17/2014 4:49 AM

## 2014-05-30 ENCOUNTER — Encounter (HOSPITAL_COMMUNITY): Payer: Self-pay | Admitting: Emergency Medicine

## 2014-05-30 ENCOUNTER — Emergency Department (HOSPITAL_COMMUNITY)
Admission: EM | Admit: 2014-05-30 | Discharge: 2014-05-30 | Disposition: A | Discharge status: Home / Self Care | Payer: Medicare Other | Attending: Emergency Medicine | Admitting: Emergency Medicine

## 2014-05-30 ENCOUNTER — Emergency Department (HOSPITAL_COMMUNITY): Payer: Medicare Other

## 2014-05-30 DIAGNOSIS — S0990XA Unspecified injury of head, initial encounter: Secondary | ICD-10-CM | POA: Insufficient documentation

## 2014-05-30 DIAGNOSIS — W19XXXA Unspecified fall, initial encounter: Secondary | ICD-10-CM

## 2014-05-30 DIAGNOSIS — F319 Bipolar disorder, unspecified: Secondary | ICD-10-CM | POA: Insufficient documentation

## 2014-05-30 DIAGNOSIS — Z79899 Other long term (current) drug therapy: Secondary | ICD-10-CM | POA: Insufficient documentation

## 2014-05-30 DIAGNOSIS — I10 Essential (primary) hypertension: Secondary | ICD-10-CM | POA: Insufficient documentation

## 2014-05-30 DIAGNOSIS — Y9210 Unspecified residential institution as the place of occurrence of the external cause: Secondary | ICD-10-CM | POA: Insufficient documentation

## 2014-05-30 DIAGNOSIS — F0280 Dementia in other diseases classified elsewhere without behavioral disturbance: Secondary | ICD-10-CM | POA: Insufficient documentation

## 2014-05-30 DIAGNOSIS — R296 Repeated falls: Secondary | ICD-10-CM | POA: Insufficient documentation

## 2014-05-30 DIAGNOSIS — Y939 Activity, unspecified: Secondary | ICD-10-CM | POA: Insufficient documentation

## 2014-05-30 DIAGNOSIS — Z791 Long term (current) use of non-steroidal anti-inflammatories (NSAID): Secondary | ICD-10-CM | POA: Insufficient documentation

## 2014-05-30 NOTE — ED Notes (Signed)
Called to [facility]. Pt. Was found naked lying on her right side on hardwood floor by staff. Staff unsure how long she had been there. No bruises noted. Pt. Has dementia and is poor historian.

## 2014-05-30 NOTE — ED Notes (Signed)
Pt. states that she remembers hitting her head on the floor when she fell and she has been sleepy ever since it happen. MD notified.

## 2014-05-30 NOTE — ED Provider Notes (Addendum)
CSN: [id number]     Arrival date & time 05/30/14  0439 History   First MD Initiated Contact with Patient 05/30/14 0534     Chief Complaint  Patient presents with  . Fall     (Consider location/radiation/quality/duration/timing/severity/associated sxs/prior Treatment) HPI Comments: Patient is an 80 year old female history of dementia. She was brought by EMS after a fall at a nursing home. She is apparently found on the floor beside her bed. She is brought here for evaluation. The patient adds little to the history secondary to dementia, but denies to me she is having any discomfort. She cannot tell me exactly why she fell.  Patient is a 80 y.o. female presenting with fall. The history is provided by the patient and the EMS personnel.  Fall This is a new problem. The current episode started less than 1 hour ago. The problem occurs constantly. The problem has not changed since onset.Pertinent negatives include no chest pain, no abdominal pain, no headaches and no shortness of breath. Nothing aggravates the symptoms. Nothing relieves the symptoms. She has tried nothing for the symptoms. The treatment provided no relief.    Past Medical History  Diagnosis Date  . Alzheimer disease   . Dementia   . Bipolar 1 disorder   . Hypertension    Past Surgical History  Procedure Laterality Date  . Cholecystectomy    . Abdominal hysterectomy      partial   No family history on file. History  Substance Use Topics  . Smoking status: Never Smoker   . Smokeless tobacco: Not on file  . Alcohol Use: No   OB History   Grav Para Term Preterm Abortions TAB SAB Ect Mult Living                 Review of Systems  Respiratory: Negative for shortness of breath.   Cardiovascular: Negative for chest pain.  Gastrointestinal: Negative for abdominal pain.  Neurological: Negative for headaches.  All other systems reviewed and are negative.     Allergies  Review of patient's allergies indicates no  known allergies.  Home Medications   Prior to Admission medications   Medication Sig Start Date End Date Taking? Authorizing Provider  acetaminophen (TYLENOL) 500 MG tablet Take 500 mg by mouth 3 (three) times daily.   Yes Historical Provider, MD  alum & mag hydroxide-simeth (MAALOX/MYLANTA) 200-200-20 MG/5ML suspension Take 30 mLs by mouth every 6 (six) hours as needed for indigestion or heartburn.   Yes Historical Provider, MD  amLODipine (NORVASC) 10 MG tablet Take 10 mg by mouth daily.   Yes Historical Provider, MD  diclofenac sodium (VOLTAREN) 1 % GEL Apply 4 g topically 4 (four) times daily.   Yes Historical Provider, MD  divalproex (DEPAKOTE SPRINKLE) 125 MG capsule Take 250 mg by mouth 4 (four) times daily.   Yes Historical Provider, MD  haloperidol (HALDOL) 5 MG tablet Take 7.5 mg by mouth 2 (two) times daily. Take 1.5 tablets   Yes Historical Provider, MD  hydrochlorothiazide (HYDRODIURIL) 25 MG tablet Take 25 mg by mouth daily.   Yes Historical Provider, MD  Memantine HCl ER (NAMENDA XR) 28 MG CP24 Take 28 mg by mouth daily.   Yes Historical Provider, MD  metoprolol tartrate (LOPRESSOR) 25 MG tablet Take 25 mg by mouth daily.   Yes Historical Provider, MD  QUEtiapine (SEROQUEL) 100 MG tablet Take 100 mg by mouth at bedtime.   Yes Historical Provider, MD  traZODone (DESYREL) 50 MG tablet Take 50  mg by mouth at bedtime.   Yes Historical Provider, MD  venlafaxine (EFFEXOR) 37.5 MG tablet Take 37.5 mg by mouth 3 (three) times daily with meals.   Yes Historical Provider, MD  loperamide (IMODIUM) 2 MG capsule Take 2 mg by mouth as needed for diarrhea or loose stools.    Historical Provider, MD  LORazepam (ATIVAN) 0.5 MG tablet Take 0.25 mg by mouth every 12 (twelve) hours as needed for anxiety.    Historical Provider, MD  magnesium hydroxide (MILK OF MAGNESIA) 400 MG/5ML suspension Take 30 mLs by mouth at bedtime as needed for mild constipation.    Historical Provider, MD   BP 97/81   Pulse 56  Temp(Src) 97.9 F (36.6 C) (Oral)  Resp 15  SpO2 100% Physical Exam  Nursing note and vitals reviewed. Constitutional: She appears well-developed and well-nourished. No distress.  HENT:  Head: Normocephalic and atraumatic.  Mouth/Throat: Oropharynx is clear and moist.  Eyes: EOM are normal. Pupils are equal, round, and reactive to light.  Neck: Normal range of motion. Neck supple.  There is no bony tenderness in the cervical spine. She has free range of motion of her neck with no apparent discomfort.  Cardiovascular: Normal rate, regular rhythm and normal heart sounds.   No murmur heard. Pulmonary/Chest: Effort normal and breath sounds normal. She exhibits no tenderness.  Abdominal: Soft. Bowel sounds are normal. There is no tenderness.  Musculoskeletal: Normal range of motion. She exhibits no edema.  All 4 extremities appear grossly normal without deformity. There is free range of motion of the shoulders, elbows, wrists, hips, knees, and ankles without discomfort. Pelvis is stable.  Neurological: She is alert. No cranial nerve deficit. She exhibits normal muscle tone. Coordination normal.  The patient is awake and alert. She is somewhat confused but responds to questions and commands appropriately. She moves all 4 extremities and there is no focal deficit.  Skin: She is not diaphoretic.    ED Course  Procedures (including critical care time) Labs Review Labs Reviewed - No data to display  Imaging Review No results found.   EKG Interpretation None      MDM   Final diagnoses:  None    Patient was brought here for evaluation after a fall. After thorough physical examination, I see no evidence for traumatic injury and see no indication for imaging studies. At this point I feel as though she is stable for return to the nursing home. She is to be brought back for any other new complaints.    Geoffery Lyonsouglas Shamika Pedregon, MD 05/30/14 [phone]    The patient did complain of a mild  headache prior to being discharged. For this reason she did go to radiology for a CT scan of her head. This revealed the suggestion of focal acute infarct in the left posterior temporal region, however this does not fit the clinical picture of a trauma. She is moving all extremities and from what I can tell she is at her baseline. I still feel as though she is appropriate for discharge.  Geoffery Lyonsouglas Endora Teresi, MD 05/30/14 [phone]

## 2014-05-30 NOTE — ED Notes (Signed)
Patient transported back from CT 

## 2014-05-30 NOTE — Discharge Instructions (Signed)
Return to the emergency department for any changes in behavior, or complaints of pain.   Fall Prevention in Hospitals As a hospital patient, your condition and the treatments you receive can increase your risk for falls. Some additional risk factors for falls in a hospital include:  Being in an unfamiliar environment.  Being on bed rest.  Your surgery.  Taking certain medicines.  Your tubing requirements, such as intravenous (IV) therapy or catheters. It is important that you learn how to decrease fall risks while at the hospital. Below are important tips that can help prevent falls. SAFETY TIPS FOR PREVENTING FALLS Talk about your risk of falling.  Ask your caregiver why you are at risk for falling. Is it your medicine, illness, tubing placement, or something else?  Make a plan with your caregiver to keep you safe from falls.  Ask your caregiver or pharmacist about side effect of your medicines. Some medicines can make you dizzy or affect your coordination. Ask for help.  Ask for help before getting out of bed. You may need to press your call button.  Ask for assistance in getting you safely to the toilet.  Ask for a walker or cane to be put at your bedside. Ask that most of the side rails on your bed be placed up before your caregiver leaves the room.  Ask family or friends to sit with you.  Ask for things that are out of your reach, such as your glasses, hearing aids, telephone, bedside table, or call button. Follow these tips to avoid falling:  Stay lying or seated, rather than standing, while waiting for help.  Wear rubber-soled slippers or shoes whenever you walk in the hospital.  Avoid quick, sudden movements.  Change positions slowly.  Sit on the side of your bed before standing.  Stand up slowly and wait before you start to walk.  Let your caregiver know if there is a spill on the floor.  Pay careful attention to the medical equipment, electrical cords, and  tubes around you.  When you need help, use your call button by your bed or in the bathroom. Wait for one of your caregivers to help you.  If you feel dizzy or unsure of your footing, return to bed and wait for assistance.  Avoid being distracted by the TV, telephone, or another person in your room.  Do not lean or support yourself on rolling objects, such as IV poles or bedside tables. Document Released: 12/03/2000 Document Revised: 11/22/2012 Document Reviewed: 08/13/2012 [facility] Patient Information 2014 [place], [place].

## 2014-05-30 NOTE — ED Notes (Signed)
PTAR at bedside 

## 2014-06-13 ENCOUNTER — Inpatient Hospital Stay (HOSPITAL_COMMUNITY)
Admission: EM | Admit: 2014-06-13 | Discharge: 2014-06-18 | DRG: 640 | Disposition: A | Discharge status: Skilled Nursing Facility | Payer: Medicare Other | Attending: Family Medicine | Admitting: Family Medicine

## 2014-06-13 ENCOUNTER — Other Ambulatory Visit: Payer: Self-pay

## 2014-06-13 ENCOUNTER — Emergency Department (HOSPITAL_COMMUNITY): Payer: Medicare Other

## 2014-06-13 ENCOUNTER — Encounter (HOSPITAL_COMMUNITY): Payer: Self-pay | Admitting: Emergency Medicine

## 2014-06-13 DIAGNOSIS — G9340 Encephalopathy, unspecified: Secondary | ICD-10-CM | POA: Diagnosis present

## 2014-06-13 DIAGNOSIS — N179 Acute kidney failure, unspecified: Secondary | ICD-10-CM | POA: Diagnosis present

## 2014-06-13 DIAGNOSIS — I10 Essential (primary) hypertension: Secondary | ICD-10-CM

## 2014-06-13 DIAGNOSIS — F0280 Dementia in other diseases classified elsewhere without behavioral disturbance: Secondary | ICD-10-CM | POA: Diagnosis present

## 2014-06-13 DIAGNOSIS — E870 Hyperosmolality and hypernatremia: Principal | ICD-10-CM

## 2014-06-13 DIAGNOSIS — E876 Hypokalemia: Secondary | ICD-10-CM | POA: Diagnosis present

## 2014-06-13 DIAGNOSIS — E860 Dehydration: Secondary | ICD-10-CM

## 2014-06-13 DIAGNOSIS — Z79899 Other long term (current) drug therapy: Secondary | ICD-10-CM

## 2014-06-13 DIAGNOSIS — R509 Fever, unspecified: Secondary | ICD-10-CM

## 2014-06-13 DIAGNOSIS — R1310 Dysphagia, unspecified: Secondary | ICD-10-CM | POA: Diagnosis present

## 2014-06-13 DIAGNOSIS — G939 Disorder of brain, unspecified: Secondary | ICD-10-CM | POA: Diagnosis present

## 2014-06-13 DIAGNOSIS — F319 Bipolar disorder, unspecified: Secondary | ICD-10-CM | POA: Diagnosis present

## 2014-06-13 DIAGNOSIS — M899 Disorder of bone, unspecified: Secondary | ICD-10-CM | POA: Diagnosis present

## 2014-06-13 DIAGNOSIS — R000 Tachycardia, unspecified: Secondary | ICD-10-CM

## 2014-06-13 DIAGNOSIS — R6510 Systemic inflammatory response syndrome (SIRS) of non-infectious origin without acute organ dysfunction: Secondary | ICD-10-CM

## 2014-06-13 DIAGNOSIS — L89609 Pressure ulcer of unspecified heel, unspecified stage: Secondary | ICD-10-CM | POA: Diagnosis present

## 2014-06-13 DIAGNOSIS — F0390 Unspecified dementia without behavioral disturbance: Secondary | ICD-10-CM | POA: Diagnosis present

## 2014-06-13 DIAGNOSIS — I4891 Unspecified atrial fibrillation: Secondary | ICD-10-CM | POA: Diagnosis present

## 2014-06-13 DIAGNOSIS — Z86011 Personal history of benign neoplasm of the brain: Secondary | ICD-10-CM

## 2014-06-13 DIAGNOSIS — M949 Disorder of cartilage, unspecified: Secondary | ICD-10-CM

## 2014-06-13 DIAGNOSIS — I498 Other specified cardiac arrhythmias: Secondary | ICD-10-CM | POA: Diagnosis present

## 2014-06-13 DIAGNOSIS — N1330 Unspecified hydronephrosis: Secondary | ICD-10-CM | POA: Diagnosis present

## 2014-06-13 DIAGNOSIS — L8990 Pressure ulcer of unspecified site, unspecified stage: Secondary | ICD-10-CM | POA: Diagnosis present

## 2014-06-13 DIAGNOSIS — L89899 Pressure ulcer of other site, unspecified stage: Secondary | ICD-10-CM | POA: Diagnosis present

## 2014-06-13 DIAGNOSIS — G309 Alzheimer's disease, unspecified: Secondary | ICD-10-CM | POA: Diagnosis present

## 2014-06-13 LAB — BASIC METABOLIC PANEL
BUN: 44 mg/dL — ABNORMAL HIGH (ref 6–23)
CO2: 31 meq/L (ref 19–32)
CREATININE: 0.78 mg/dL (ref 0.50–1.10)
Calcium: 8.7 mg/dL (ref 8.4–10.5)
Chloride: 115 mEq/L — ABNORMAL HIGH (ref 96–112)
GFR calc Af Amer: 86 mL/min — ABNORMAL LOW (ref >90)
GFR calc non Af Amer: 74 mL/min — ABNORMAL LOW (ref >90)
GLUCOSE: 121 mg/dL — AB (ref 70–99)
Potassium: 3.5 mEq/L — ABNORMAL LOW (ref 3.7–5.3)
Sodium: 159 mEq/L — ABNORMAL HIGH (ref 137–147)

## 2014-06-13 LAB — COMPREHENSIVE METABOLIC PANEL
ALT: 19 U/L (ref 0–35)
AST: 24 U/L (ref 0–37)
Albumin: 1.8 g/dL — ABNORMAL LOW (ref 3.5–5.2)
Alkaline Phosphatase: 80 U/L (ref 39–117)
BUN: 50 mg/dL — AB (ref 6–23)
CHLORIDE: 112 meq/L (ref 96–112)
CO2: 32 mEq/L (ref 19–32)
CREATININE: 0.97 mg/dL (ref 0.50–1.10)
Calcium: 9.2 mg/dL (ref 8.4–10.5)
GFR calc Af Amer: 60 mL/min — ABNORMAL LOW (ref >90)
GFR calc non Af Amer: 52 mL/min — ABNORMAL LOW (ref >90)
Glucose, Bld: 167 mg/dL — ABNORMAL HIGH (ref 70–99)
POTASSIUM: 3.6 meq/L — AB (ref 3.7–5.3)
Sodium: 157 mEq/L — ABNORMAL HIGH (ref 137–147)
TOTAL PROTEIN: 6.3 g/dL (ref 6.0–8.3)
Total Bilirubin: 0.3 mg/dL (ref 0.3–1.2)

## 2014-06-13 LAB — CBC WITH DIFFERENTIAL/PLATELET
BASOS ABS: 0 10*3/uL (ref 0.0–0.1)
Basophils Relative: 0 % (ref 0–1)
EOS PCT: 0 % (ref 0–5)
Eosinophils Absolute: 0 10*3/uL (ref 0.0–0.7)
HCT: 37.9 % (ref 36.0–46.0)
Hemoglobin: 12.1 g/dL (ref 12.0–15.0)
LYMPHS ABS: 2.3 10*3/uL (ref 0.7–4.0)
Lymphocytes Relative: 8 % — ABNORMAL LOW (ref 12–46)
MCH: 31.4 pg (ref 26.0–34.0)
MCHC: 31.9 g/dL (ref 30.0–36.0)
MCV: 98.4 fL (ref 78.0–100.0)
MONOS PCT: 12 % (ref 3–12)
Monocytes Absolute: 3.4 10*3/uL — ABNORMAL HIGH (ref 0.1–1.0)
NEUTROS PCT: 80 % — AB (ref 43–77)
Neutro Abs: 22.5 10*3/uL — ABNORMAL HIGH (ref 1.7–7.7)
PLATELETS: 523 10*3/uL — AB (ref 150–400)
RBC: 3.85 MIL/uL — AB (ref 3.87–5.11)
RDW: 16.3 % — AB (ref 11.5–15.5)
WBC: 28.2 10*3/uL — AB (ref 4.0–10.5)

## 2014-06-13 LAB — URINALYSIS, ROUTINE W REFLEX MICROSCOPIC
Glucose, UA: NEGATIVE mg/dL
HGB URINE DIPSTICK: NEGATIVE
KETONES UR: NEGATIVE mg/dL
LEUKOCYTES UA: NEGATIVE
Nitrite: NEGATIVE
PH: 5.5 (ref 5.0–8.0)
Protein, ur: NEGATIVE mg/dL
Specific Gravity, Urine: 1.025 (ref 1.005–1.030)
Urobilinogen, UA: 1 mg/dL (ref 0.0–1.0)

## 2014-06-13 LAB — TSH: TSH: 2.68 u[IU]/mL (ref 0.350–4.500)

## 2014-06-13 LAB — I-STAT CG4 LACTIC ACID, ED: Lactic Acid, Venous: 1.91 mmol/L (ref 0.5–2.2)

## 2014-06-13 MED ORDER — ACETAMINOPHEN 650 MG RE SUPP: 650.0000 mg | RECTAL | Status: DC | PRN

## 2014-06-13 MED ORDER — ACETAMINOPHEN 650 MG RE SUPP: 650.0000 mg | Freq: Once | RECTAL | Status: DC

## 2014-06-13 MED ORDER — ACETAMINOPHEN 325 MG PO TABS
650.0000 mg | ORAL_TABLET | Freq: Four times a day (QID) | ORAL | Status: DC | PRN
  Administered 2014-06-15: 650 mg via ORAL
  Filled 2014-06-13: qty 2

## 2014-06-13 MED ORDER — HYDROCHLOROTHIAZIDE 25 MG PO TABS
25.0000 mg | ORAL_TABLET | Freq: Every day | ORAL | Status: DC
  Administered 2014-06-14 – 2014-06-15 (×2): 25 mg via ORAL
  Filled 2014-06-13 (×3): qty 1

## 2014-06-13 MED ORDER — SODIUM CHLORIDE 0.9 % IV BOLUS (SEPSIS)
1000.0000 mL | Freq: Once | INTRAVENOUS | Status: AC
  Administered 2014-06-13: 1000 mL via INTRAVENOUS

## 2014-06-13 MED ORDER — METOPROLOL TARTRATE 1 MG/ML IV SOLN
2.5000 mg | Freq: Two times a day (BID) | INTRAVENOUS | Status: DC
  Administered 2014-06-13: 2.5 mg via INTRAVENOUS
  Filled 2014-06-13 (×3): qty 5

## 2014-06-13 MED ORDER — HYDRALAZINE HCL 20 MG/ML IJ SOLN
5.0000 mg | Freq: Once | INTRAMUSCULAR | Status: AC
  Administered 2014-06-13: 5 mg via INTRAVENOUS
  Filled 2014-06-13: qty 0.25

## 2014-06-13 MED ORDER — SODIUM CHLORIDE 0.9 % IJ SOLN
3.0000 mL | Freq: Two times a day (BID) | INTRAMUSCULAR | Status: DC
  Administered 2014-06-14 – 2014-06-17 (×3): 3 mL via INTRAVENOUS

## 2014-06-13 MED ORDER — SODIUM CHLORIDE 0.9 % IV SOLN
Freq: Once | INTRAVENOUS | Status: AC
  Administered 2014-06-13: 15:00:00 via INTRAVENOUS

## 2014-06-13 MED ORDER — LORAZEPAM 2 MG/ML IJ SOLN
0.5000 mg | Freq: Four times a day (QID) | INTRAMUSCULAR | Status: DC | PRN
  Administered 2014-06-14: 0.5 mg via INTRAVENOUS
  Filled 2014-06-13: qty 1

## 2014-06-13 MED ORDER — HEPARIN SODIUM (PORCINE) 5000 UNIT/ML IJ SOLN
5000.0000 [IU] | Freq: Three times a day (TID) | INTRAMUSCULAR | Status: DC
  Administered 2014-06-13 – 2014-06-18 (×16): 5000 [IU] via SUBCUTANEOUS
  Filled 2014-06-13 (×18): qty 1

## 2014-06-13 MED ORDER — ACETAMINOPHEN 650 MG RE SUPP: 650.0000 mg | Freq: Four times a day (QID) | RECTAL | Status: DC | PRN

## 2014-06-13 MED ORDER — DIVALPROEX SODIUM 250 MG PO DR TAB
250.0000 mg | DELAYED_RELEASE_TABLET | Freq: Four times a day (QID) | ORAL | Status: DC
  Filled 2014-06-13 (×2): qty 1

## 2014-06-13 MED ORDER — VALPROATE SODIUM 500 MG/5ML IV SOLN
250.0000 mg | Freq: Four times a day (QID) | INTRAVENOUS | Status: DC
  Administered 2014-06-13 – 2014-06-16 (×11): 250 mg via INTRAVENOUS
  Filled 2014-06-13 (×13): qty 2.5

## 2014-06-13 MED ORDER — HALOPERIDOL LACTATE 5 MG/ML IJ SOLN: 1.0000 mg | Freq: Four times a day (QID) | INTRAMUSCULAR | Status: DC | PRN

## 2014-06-13 MED ORDER — SODIUM CHLORIDE 0.45 % IV SOLN
INTRAVENOUS | Status: DC
  Administered 2014-06-13: 16:00:00 via INTRAVENOUS

## 2014-06-13 NOTE — ED Provider Notes (Addendum)
CSN: [id number]     Arrival date & time 06/13/14  1044 History   First MD Initiated Contact with Patient 06/13/14 1101     Chief Complaint  Patient presents with  . Altered Mental Status      HPI  Presents from urgent care facility. Has been less active. Has not eaten or drank since yesterday. Is history of dementia but has been "more confused and "burst-type report. [facility] for her dementia. Now resides at a local care facility memory unit. The patient is unable to provide additional symptoms or details of her history due to her dementia.  Past Medical History  Diagnosis Date  . Alzheimer disease   . Dementia   . Bipolar 1 disorder   . Hypertension    Past Surgical History  Procedure Laterality Date  . Cholecystectomy    . Abdominal hysterectomy      partial   History reviewed. No pertinent family history. History  Substance Use Topics  . Smoking status: Never Smoker   . Smokeless tobacco: Not on file  . Alcohol Use: No   OB History   Grav Para Term Preterm Abortions TAB SAB Ect Mult Living                 Review of Systems  Unable to perform ROS: Dementia      Allergies  Review of patient's allergies indicates no known allergies.  Home Medications   Prior to Admission medications   Medication Sig Start Date End Date Taking? Authorizing Provider  alum & mag hydroxide-simeth (MAALOX/MYLANTA) 200-200-20 MG/5ML suspension Take 30 mLs by mouth 4 (four) times daily as needed for indigestion or heartburn.    Yes Historical Provider, MD  amLODipine (NORVASC) 10 MG tablet Take 10 mg by mouth daily.   Yes Historical Provider, MD  divalproex (DEPAKOTE) 250 MG DR tablet Take 250 mg by mouth 4 (four) times daily.   Yes Historical Provider, MD  guaifenesin (ROBITUSSIN) 100 MG/5ML syrup Take 200 mg by mouth every 6 (six) hours as needed for cough.   Yes Historical Provider, MD  haloperidol (HALDOL) 1 MG tablet Take 1 mg by  mouth every 8 (eight) hours.   Yes Historical Provider, MD  hydrochlorothiazide (HYDRODIURIL) 25 MG tablet Take 25 mg by mouth daily.   Yes Historical Provider, MD  loperamide (IMODIUM) 2 MG capsule Take 2 mg by mouth as needed for diarrhea or loose stools.   Yes Historical Provider, MD  LORazepam (ATIVAN) 0.5 MG tablet Take 0.25 mg by mouth 2 (two) times daily as needed (agitation).    Yes Historical Provider, MD  magnesium hydroxide (MILK OF MAGNESIA) 400 MG/5ML suspension Take 30 mLs by mouth at bedtime as needed for mild constipation.   Yes Historical Provider, MD  metoprolol tartrate (LOPRESSOR) 25 MG tablet Take 25 mg by mouth daily.   Yes Historical Provider, MD  neomycin-bacitracin-polymyxin (NEOSPORIN) ointment Apply 1 application topically as needed for wound care. apply to eye   Yes Historical Provider, MD  QUEtiapine (SEROQUEL) 100 MG tablet Take 100 mg by mouth at bedtime.   Yes Historical Provider, MD  traZODone (DESYREL) 50 MG tablet Take 50 mg by mouth at bedtime.   Yes Historical Provider, MD  venlafaxine (EFFEXOR) 37.5 MG tablet Take 37.5 mg by mouth 3 (three) times daily with meals.   Yes Historical Provider, MD   BP 183/111  Pulse 114  Temp(Src) 100 F (37.8 C) (Other (Comment))  Resp 22  SpO2 99% Physical Exam  Constitutional: She appears listless. She is sleeping. She is easily aroused.  Non-toxic appearance. She does not have a sickly appearance. She does not appear ill.  Plan frail-appearing elderly female. Will open her eyes to voice respond verbally to simple questioning.  HENT:  Mucous membranes of  the mouth are leathery dry. Conjunctiva are not pale. No scleral icterus.  Neck:   Supple. No JVD. No bruits  Cardiovascular:  Sinus tach occasional PACs. Rate 120s.  Pulmonary/Chest:  Bilateral breath sounds. No increased work of breathing  Abdominal:  Abdomen soft and benign. Rectal soft stool guaiac negative  Musculoskeletal:  Moves all four extremities.   Neurological: She is easily aroused. She appears listless.  Dementia    ED Course  Procedures (including critical care time) Labs Review Labs Reviewed  URINALYSIS, ROUTINE W REFLEX MICROSCOPIC - Abnormal; Notable for the following:    Color, Urine AMBER (*)    APPearance HAZY (*)    Bilirubin Urine SMALL (*)    All other components within normal limits  CBC WITH DIFFERENTIAL - Abnormal; Notable for the following:    WBC 28.2 (*)    RBC 3.85 (*)    RDW 16.3 (*)    Platelets 523 (*)    Neutrophils Relative % 80 (*)    Lymphocytes Relative 8 (*)    Neutro Abs 22.5 (*)    Monocytes Absolute 3.4 (*)    All other components within normal limits  COMPREHENSIVE METABOLIC PANEL - Abnormal; Notable for the following:    Sodium 157 (*)    Potassium 3.6 (*)    Glucose, Bld 167 (*)    BUN 50 (*)    Albumin 1.8 (*)    GFR calc non Af Amer 52 (*)    GFR calc Af Amer 60 (*)    All other components within normal limits  CULTURE, BLOOD (ROUTINE X 2)  CULTURE, BLOOD (ROUTINE X 2)  URINE CULTURE  I-STAT CG4 LACTIC ACID, ED    Imaging Review Ct Abdomen Pelvis Wo Contrast  06/13/2014   CLINICAL DATA:  Hematuria.  EXAM: CT ABDOMEN AND PELVIS WITHOUT CONTRAST  TECHNIQUE: Multidetector CT imaging of the abdomen and pelvis was performed following the standard protocol without IV contrast.  COMPARISON:  None.  FINDINGS: LUNG BASES: Included view of the lung bases are clear. The visualized heart and pericardium are unremarkable.  KIDNEYS/BLADDER: Kidneys are orthotopic, demonstrating normal size and morphology. Mild right hydronephrosis with partial effacement of the renal pelvic sinus fat. Apparent right extra renal pelvis. Right extra renal pelvis. The unopacified ureters are normal in course and caliber. Urinary bladder is decompressed containing a Foley catheter and air.  SOLID ORGANS: The liver, spleen, pancreas are unremarkable for this non-contrast examination. Non visualized gallbladder likely  reflects cholecystectomy. Thickened appearance of the adrenal glands suggests hyperplasia.  GASTROINTESTINAL TRACT: The stomach, small and large bowel are normal in course and caliber without inflammatory changes, the sensitivity may be decreased by lack of enteric contrast. Large amount of stool projecting at the rectal vault. The appendix is not discretely identified, however there are no inflammatory changes in the right lower quadrant.  PERITONEUM/RETROPERITONEUM: No intraperitoneal free fluid nor free air. Aortoiliac vessels are normal in course and caliber, mild calcific atherosclerosis. No lymphadenopathy by CT size criteria. Status post hysterectomy.  SOFT TISSUES/ OSSEOUS STRUCTURES: Nonsuspicious. Localizer demonstrates a left ulna plate and screw. Osteopenia. Grade 1 L4-5 anterolisthesis on spondylotic basis with severe lower lumbar facet  arthropathy. Probable enchondroma left pubic symphysis.  IMPRESSION: Mild apparent right hydronephrosis and extra renal pelvis which may be better characterized with ultrasound as clinically indicated. Foley catheter decompresses urinary bladder.  Large amount of stool projecting at rectal vault without bowel obstruction.   Electronically Signed   By: Awilda Metroourtnay  Bloomer   On: 06/13/2014 14:31   Dg Chest Port 1 View  (if Code Sepsis Called)  06/13/2014   CLINICAL DATA:  Altered mental status.  EXAM: PORTABLE CHEST - 1 VIEW  COMPARISON:  05/17/2014.  FINDINGS: Patient is rotated to the right. Mediastinum and hilar structures are normal. Lungs are clear of acute infiltrates. No pleural effusion or pneumothorax. No acute bony abnormality. Degenerative changes thoracic spine and both shoulders.  IMPRESSION: No active disease.  Chest is stable from prior exam.   Electronically Signed   By: Maisie Fushomas  Register   On: 06/13/2014 12:37     EKG Interpretation None      MDM   Final diagnoses:  Dehydration  Acute kidney injury  Fever, unspecified fever cause  Dementia,  without behavioral disturbance    Clinically, and her laboratory values patient's dehydrated. Low-grade fever without source. Blood  cultures and urine cultures pending. We have contacted her care facility and they're trying to determine CODE STATUS. Admission for IV fluids.  Call for unassigned admission return to the family medicine resident. Patient will be seen, and admitted .   Rolland PorterMark James, MD 06/13/14 1453  Rolland PorterMark James, MD 06/13/14 [phone]

## 2014-06-13 NOTE — H&P (Addendum)
FMTS ATTENDING ADMISSION NOTE Kehinde Eniola,MD I  have seen and examined this patient, reviewed their chart. I have discussed this patient with the resident. I agree with the resident's findings, assessment and care plan.  80 Y/O F with PMX of dementia, hypertension and bipolar brought to the ED from SNF for change in mental status and fever. Adequate hx could not be obtain as patient is not communicating verbally. She did open her eyes and was mumbling some words which I could not understand.  No current facility-administered medications on file prior to encounter.   Current Outpatient Prescriptions on File Prior to Encounter  Medication Sig Dispense Refill  . alum & mag hydroxide-simeth (MAALOX/MYLANTA) 200-200-20 MG/5ML suspension Take 30 mLs by mouth 4 (four) times daily as needed for indigestion or heartburn.       Marland Kitchen amLODipine (NORVASC) 10 MG tablet Take 10 mg by mouth daily.      . hydrochlorothiazide (HYDRODIURIL) 25 MG tablet Take 25 mg by mouth daily.      Marland Kitchen loperamide (IMODIUM) 2 MG capsule Take 2 mg by mouth as needed for diarrhea or loose stools.      Marland Kitchen LORazepam (ATIVAN) 0.5 MG tablet Take 0.25 mg by mouth 2 (two) times daily as needed (agitation).       . magnesium hydroxide (MILK OF MAGNESIA) 400 MG/5ML suspension Take 30 mLs by mouth at bedtime as needed for mild constipation.      . metoprolol tartrate (LOPRESSOR) 25 MG tablet Take 25 mg by mouth daily.      . QUEtiapine (SEROQUEL) 100 MG tablet Take 100 mg by mouth at bedtime.      . traZODone (DESYREL) 50 MG tablet Take 50 mg by mouth at bedtime.      Marland Kitchen venlafaxine (EFFEXOR) 37.5 MG tablet Take 37.5 mg by mouth 3 (three) times daily with meals.       Past Medical History  Diagnosis Date  . Alzheimer disease   . Dementia   . Bipolar 1 disorder   . Hypertension    Filed Vitals:   06/13/14 1530 06/13/14 1545 06/13/14 1555 06/13/14 1615  BP: 163/74 173/77    Pulse: 105 103    Temp:      TempSrc:      Resp: 22 27     Weight:   156 lb 9.6 oz (71.033 kg) 150 lb 9.2 oz (68.3 kg)  SpO2: 95% 91%      Exam: Gen: Calm in bed,does not seem to be in distress. Neuro: Opens eye intermittently to tough. Orientation could not be assessed. Reflex intact, full neuro exam could not be completed. Resp: Air entry equal and clear B/L. Heart: S1 S2 regularly irregular rhythm with normal rate. No murmur. Abd: Soft, NT/ND. BS+ and normal. JGO:TLXB B/Lknee swelling,no erythema, seem tender with palpation. Trace pedal edema.  A/P: 80 Y/O F with 1. Change in mental status:     May be due to sepsis (Fever, elevated WBC and tachycardia) vs electrolyte imbalance.     Unclear what her baseline is but she does open her eyes intermittently and tried to communicate.     CT head done few days ago reviewed with no acute intracranial pathology.Will recommend CT head if symptom worsens.     Neuro check.     Fall precaution.  2.SIRS: Met criteria. Possible sepsis but not in shock.    Source unknown.    UA looks ok    Agree with blood culture.  A/B coverage while awaiting blood culture recommended.    Tylenol prn fever.    Monitor vital signs.  3. Hypernatremia:    I agree with 1/2 NS IVF.    Monitor serum Na level.  4. HTN: Likely compliant with her home antihypertensive since she is from SNF.     Plan to restart some of her medications especially Metoprolol due to her HR.  5. Tachycardia with some irregularity on exam:    No hx of Afib.    Telemetry monitoring.     Bmet to assess electrolyte especially potassium,replace electrolyte as needed.     Restart Metoprolol.     Check serum TSH.     Consider Cardiology consult is no improvement and cardiac rythym remains irregular.

## 2014-06-13 NOTE — ED Notes (Signed)
Pt is a FULL CODE

## 2014-06-13 NOTE — H&P (Signed)
FMTS ATTENDING ADMISSION NOTE Kehinde Eniola,MD I  have seen and examined this patient, reviewed their chart. I have discussed this patient with the resident. I agree with the resident's findings, assessment and care plan.   

## 2014-06-13 NOTE — H&P (Signed)
Family Medicine Teaching [facility] Admission History and Physical Service Pager: [phone]  Patient name: Jenny Patterson Medical record number: [id number] Date of birth: Oct 23, 1927 Age: 80 y.o. Gender: female  Primary Care Provider: No PCP Per Patient Consultants: None Code Status: Full code  Chief Complaint: Altered mental status  Assessment and Plan: Jenny Patterson is a 80 y.o. female presenting with SIRS, hypernatremia and AMS. PMH is significant for Bipolar disorder, dementia and hypertension  # SIRS: Patient febrile, tachycardic with elevated white count and tachypnea. No source of infection found on initial evaluation. Chest x-ray, CT abdomen and UA with no signs of infection. EKG sinus arrythmia - Admit to inpatient, telemetry, attending Dr. Lum Babe - follow-up blood cultures - follow-up urine culture - trend fever curve - monitor vital signs - will hold off on empiric antibiotics at this time as patient is HD stable.  - will monitor closely with low threshold to start empiric abx  # Hypernatremia: Patient currently with sodium of 157. Previous sodium of 145 on 5/28. Most likely from decreased oral intake of fluids. - 1/2NS @100ml /hr with goal of no more than 0.105meq/L/hr increase (rate of correction 0.25 mEq per hour). Will add free water when patient okay to take PO - bmet q4 hours to monitor sodium  # Acute encephalopathy: GCS 11 (Eye: 3, Verbal: 2, Motor: 6). Most likely caused by hypernatremia vs sedation from psych meds vs possible infection (although only meets SIRS criteria because no source of infection) - neuro checks q4hrs - will treat per above problems  # Bipolar disorder/psych: on Depakote, Effexor, Seroquel, Trazadone, ativan and Haldol - Will continue Depakote - Continue haldol and ativan as PRN for agitation only - Hold Effexor, Seroquel and Trazadone to reduce sedation effects  # Hypertension: on amlodipine, hctz and metoprolol at home (although metoprolol  dose is QD instead of BID). - hold amlodipine since patient NPO. Will resume when passes swallow study - will hold HCTZ - metoprolol 2.5mg  IV BID; switch to PO when passes swallow study - hydralazine 5mg  IV PRN for SBP >180 - monitor vitals  # Hypokalemia: currently 3.6. - will replete as needed - monitor via bmet  FEN/GI: NPO, advance pending SLP eveluation; 1/2NS @100ml /hr Prophylaxis: Heparin subq  Disposition: Admit to inpatient telemetry  History of Present Illness: Jenny Patterson is a 80 y.o. female presenting with altered mental status and decreased. Level 5 caveat applies. Per nurse and chart review, patient recently in ED 5 weeks ago was manic and transported to [facility]. After returning to SNF, she was less responsive and had decreased PO. She was sent to ED from SNF via EMS  Arrived to ED non-responsive except to painful stimuli. Febrile to 100.6 and hypernatremic. Received NS bolus x1 and CXR, CT abdomen and UA to look for source of infection which were negative. Blood culture and urine culture obtained. No antibiotics started.  Review Of Systems: Per HPI with the following additions: None Otherwise 12 point review of systems was performed and was unremarkable.  Patient Active Problem List   Diagnosis Date Noted  . Hyponatremia 06/13/2014   Past Medical History: Past Medical History  Diagnosis Date  . Alzheimer disease   . Dementia   . Bipolar 1 disorder   . Hypertension    Past Surgical History: Past Surgical History  Procedure Laterality Date  . Cholecystectomy    . Abdominal hysterectomy      partial   Social History: History  Substance Use Topics  .  Smoking status: Never Smoker   . Smokeless tobacco: Not on file  . Alcohol Use: No   Additional social history: None  Please also refer to relevant sections of EMR.  Family History: History reviewed. No pertinent family history. Allergies and Medications: No Known Allergies No current  facility-administered medications on file prior to encounter.   Current Outpatient Prescriptions on File Prior to Encounter  Medication Sig Dispense Refill  . alum & mag hydroxide-simeth (MAALOX/MYLANTA) 200-200-20 MG/5ML suspension Take 30 mLs by mouth 4 (four) times daily as needed for indigestion or heartburn.       Marland Kitchen. amLODipine (NORVASC) 10 MG tablet Take 10 mg by mouth daily.      . hydrochlorothiazide (HYDRODIURIL) 25 MG tablet Take 25 mg by mouth daily.      Marland Kitchen. loperamide (IMODIUM) 2 MG capsule Take 2 mg by mouth as needed for diarrhea or loose stools.      Marland Kitchen. LORazepam (ATIVAN) 0.5 MG tablet Take 0.25 mg by mouth 2 (two) times daily as needed (agitation).       . magnesium hydroxide (MILK OF MAGNESIA) 400 MG/5ML suspension Take 30 mLs by mouth at bedtime as needed for mild constipation.      . metoprolol tartrate (LOPRESSOR) 25 MG tablet Take 25 mg by mouth daily.      . QUEtiapine (SEROQUEL) 100 MG tablet Take 100 mg by mouth at bedtime.      . traZODone (DESYREL) 50 MG tablet Take 50 mg by mouth at bedtime.      Marland Kitchen. venlafaxine (EFFEXOR) 37.5 MG tablet Take 37.5 mg by mouth 3 (three) times daily with meals.        Objective: BP 183/111  Pulse 114  Temp(Src) 100 F (37.8 C) (Other (Comment))  Resp 22  SpO2 99%  Exam: General: Laying in bed, resting comfortably. NAD. HEENT: PERRL, dry mucous membranes Cardiovascular: Rapid rate, irregular rhythm, no murmur Respiratory: Anterior auscultation, clear to auscultation bilaterally, no wheezing Abdomen: Soft, non-tender, non-distended Extremities: Trace pitting edema Skin: No cyanosis. Skin warm, dry, intact. Neuro: Alert; oriented to self only.  Labs and Imaging: CBC BMET   Recent Labs Lab 06/13/14 1220  WBC 28.2*  HGB 12.1  HCT 37.9  PLT 523*    Recent Labs Lab 06/13/14 1220  NA 157*  K 3.6*  CL 112  CO2 32  BUN 50*  CREATININE 0.97  GLUCOSE 167*  CALCIUM 9.2     Urinalysis    Component Value Date/Time    COLORURINE AMBER* 06/13/2014 1109   APPEARANCEUR HAZY* 06/13/2014 1109   LABSPEC 1.025 06/13/2014 1109   PHURINE 5.5 06/13/2014 1109   GLUCOSEU NEGATIVE 06/13/2014 1109   HGBUR NEGATIVE 06/13/2014 1109   BILIRUBINUR SMALL* 06/13/2014 1109   KETONESUR NEGATIVE 06/13/2014 1109   PROTEINUR NEGATIVE 06/13/2014 1109   UROBILINOGEN 1.0 06/13/2014 1109   NITRITE NEGATIVE 06/13/2014 1109   LEUKOCYTESUR NEGATIVE 06/13/2014 1109   EKG: sinus arrythmia and tachycardia  Ct Abdomen Pelvis Wo Contrast  06/13/2014   CLINICAL DATA:  Hematuria.  EXAM: CT ABDOMEN AND PELVIS WITHOUT CONTRAST  TECHNIQUE: Multidetector CT imaging of the abdomen and pelvis was performed following the standard protocol without IV contrast.  COMPARISON:  None.  FINDINGS: LUNG BASES: Included view of the lung bases are clear. The visualized heart and pericardium are unremarkable.  KIDNEYS/BLADDER: Kidneys are orthotopic, demonstrating normal size and morphology. Mild right hydronephrosis with partial effacement of the renal pelvic sinus fat. Apparent right extra renal pelvis. Right extra  renal pelvis. The unopacified ureters are normal in course and caliber. Urinary bladder is decompressed containing a Foley catheter and air.  SOLID ORGANS: The liver, spleen, pancreas are unremarkable for this non-contrast examination. Non visualized gallbladder likely reflects cholecystectomy. Thickened appearance of the adrenal glands suggests hyperplasia.  GASTROINTESTINAL TRACT: The stomach, small and large bowel are normal in course and caliber without inflammatory changes, the sensitivity may be decreased by lack of enteric contrast. Large amount of stool projecting at the rectal vault. The appendix is not discretely identified, however there are no inflammatory changes in the right lower quadrant.  PERITONEUM/RETROPERITONEUM: No intraperitoneal free fluid nor free air. Aortoiliac vessels are normal in course and caliber, mild calcific atherosclerosis. No  lymphadenopathy by CT size criteria. Status post hysterectomy.  SOFT TISSUES/ OSSEOUS STRUCTURES: Nonsuspicious. Localizer demonstrates a left ulna plate and screw. Osteopenia. Grade 1 L4-5 anterolisthesis on spondylotic basis with severe lower lumbar facet arthropathy. Probable enchondroma left pubic symphysis.  IMPRESSION: Mild apparent right hydronephrosis and extra renal pelvis which may be better characterized with ultrasound as clinically indicated. Foley catheter decompresses urinary bladder.  Large amount of stool projecting at rectal vault without bowel obstruction.   Electronically Signed   By: Awilda Metroourtnay  Bloomer   On: 06/13/2014 14:31   Dg Chest Port 1 View  (if Code Sepsis Called)  06/13/2014   CLINICAL DATA:  Altered mental status.  EXAM: PORTABLE CHEST - 1 VIEW  COMPARISON:  05/17/2014.  FINDINGS: Patient is rotated to the right. Mediastinum and hilar structures are normal. Lungs are clear of acute infiltrates. No pleural effusion or pneumothorax. No acute bony abnormality. Degenerative changes thoracic spine and both shoulders.  IMPRESSION: No active disease.  Chest is stable from prior exam.   Electronically Signed   By: Maisie Fushomas  Register   On: 06/13/2014 12:37    Jacquelin Hawkingalph Nettey, MD 06/13/2014, 3:19 PM PGY-1, [facility] FPTS Intern pager: [phone], text pages welcome  I have seen and evaluated the above patient.  Addendum in blue.  Everlene OtherJayce Cook DO Family Medicine PGY-2

## 2014-06-14 ENCOUNTER — Inpatient Hospital Stay (HOSPITAL_COMMUNITY): Payer: Medicare Other

## 2014-06-14 LAB — BASIC METABOLIC PANEL
BUN: 28 mg/dL — AB (ref 6–23)
BUN: 29 mg/dL — AB (ref 6–23)
BUN: 31 mg/dL — ABNORMAL HIGH (ref 6–23)
BUN: 33 mg/dL — AB (ref 6–23)
BUN: 37 mg/dL — ABNORMAL HIGH (ref 6–23)
BUN: 39 mg/dL — ABNORMAL HIGH (ref 6–23)
CALCIUM: 8.5 mg/dL (ref 8.4–10.5)
CALCIUM: 8.5 mg/dL (ref 8.4–10.5)
CALCIUM: 8.6 mg/dL (ref 8.4–10.5)
CALCIUM: 8.7 mg/dL (ref 8.4–10.5)
CALCIUM: 8.8 mg/dL (ref 8.4–10.5)
CHLORIDE: 116 meq/L — AB (ref 96–112)
CO2: 29 mEq/L (ref 19–32)
CO2: 30 mEq/L (ref 19–32)
CO2: 30 mEq/L (ref 19–32)
CO2: 31 mEq/L (ref 19–32)
CO2: 31 mEq/L (ref 19–32)
CO2: 31 meq/L (ref 19–32)
CREATININE: 0.61 mg/dL (ref 0.50–1.10)
CREATININE: 0.61 mg/dL (ref 0.50–1.10)
CREATININE: 0.65 mg/dL (ref 0.50–1.10)
Calcium: 8.7 mg/dL (ref 8.4–10.5)
Chloride: 114 mEq/L — ABNORMAL HIGH (ref 96–112)
Chloride: 116 mEq/L — ABNORMAL HIGH (ref 96–112)
Chloride: 112 mEq/L (ref 96–112)
Chloride: 114 mEq/L — ABNORMAL HIGH (ref 96–112)
Chloride: 115 mEq/L — ABNORMAL HIGH (ref 96–112)
Creatinine, Ser: 0.62 mg/dL (ref 0.50–1.10)
Creatinine, Ser: 0.64 mg/dL (ref 0.50–1.10)
Creatinine, Ser: 0.72 mg/dL (ref 0.50–1.10)
GFR calc Af Amer: 88 mL/min — ABNORMAL LOW (ref >90)
GFR calc Af Amer: >90 mL/min (ref >90)
GFR calc Af Amer: >90 mL/min (ref >90)
GFR calc Af Amer: >90 mL/min (ref >90)
GFR calc non Af Amer: 76 mL/min — ABNORMAL LOW (ref >90)
GFR calc non Af Amer: 79 mL/min — ABNORMAL LOW (ref >90)
GFR, EST NON AFRICAN AMERICAN: 80 mL/min — AB (ref >90)
GFR, EST NON AFRICAN AMERICAN: 80 mL/min — AB (ref >90)
GFR, EST NON AFRICAN AMERICAN: 80 mL/min — AB (ref >90)
GFR, EST NON AFRICAN AMERICAN: 79 mL/min — AB (ref >90)
GLUCOSE: 110 mg/dL — AB (ref 70–99)
GLUCOSE: 133 mg/dL — AB (ref 70–99)
GLUCOSE: 141 mg/dL — AB (ref 70–99)
GLUCOSE: 172 mg/dL — AB (ref 70–99)
Glucose, Bld: 113 mg/dL — ABNORMAL HIGH (ref 70–99)
Glucose, Bld: 126 mg/dL — ABNORMAL HIGH (ref 70–99)
POTASSIUM: 3.4 meq/L — AB (ref 3.7–5.3)
Potassium: 3.2 mEq/L — ABNORMAL LOW (ref 3.7–5.3)
Potassium: 3.3 mEq/L — ABNORMAL LOW (ref 3.7–5.3)
Potassium: 3.3 mEq/L — ABNORMAL LOW (ref 3.7–5.3)
Potassium: 3.3 mEq/L — ABNORMAL LOW (ref 3.7–5.3)
Potassium: 3.7 mEq/L (ref 3.7–5.3)
Sodium: 156 mEq/L — ABNORMAL HIGH (ref 137–147)
Sodium: 156 mEq/L — ABNORMAL HIGH (ref 137–147)
Sodium: 157 mEq/L — ABNORMAL HIGH (ref 137–147)
Sodium: 157 mEq/L — ABNORMAL HIGH (ref 137–147)
Sodium: 157 mEq/L — ABNORMAL HIGH (ref 137–147)
Sodium: 158 mEq/L — ABNORMAL HIGH (ref 137–147)

## 2014-06-14 LAB — CBC
HCT: 34.5 % — ABNORMAL LOW (ref 36.0–46.0)
HEMOGLOBIN: 10.7 g/dL — AB (ref 12.0–15.0)
MCH: 31.1 pg (ref 26.0–34.0)
MCHC: 31 g/dL (ref 30.0–36.0)
MCV: 100.3 fL — AB (ref 78.0–100.0)
Platelets: 427 10*3/uL — ABNORMAL HIGH (ref 150–400)
RBC: 3.44 MIL/uL — AB (ref 3.87–5.11)
RDW: 16.4 % — ABNORMAL HIGH (ref 11.5–15.5)
WBC: 26.2 10*3/uL — ABNORMAL HIGH (ref 4.0–10.5)

## 2014-06-14 LAB — CLOSTRIDIUM DIFFICILE BY PCR: CDIFFPCR: NEGATIVE

## 2014-06-14 LAB — URINE CULTURE
COLONY COUNT: NO GROWTH
Culture: NO GROWTH
SPECIAL REQUESTS: NORMAL

## 2014-06-14 MED ORDER — ENSURE PUDDING PO PUDG
1.0000 | Freq: Two times a day (BID) | ORAL | Status: DC
  Administered 2014-06-14 – 2014-06-17 (×6): 1 via ORAL

## 2014-06-14 MED ORDER — DEXTROSE 5 % IV SOLN
INTRAVENOUS | Status: AC
  Administered 2014-06-14: 16:00:00 via INTRAVENOUS

## 2014-06-14 MED ORDER — ASPIRIN EC 81 MG PO TBEC
81.0000 mg | DELAYED_RELEASE_TABLET | Freq: Every day | ORAL | Status: DC
  Administered 2014-06-14 – 2014-06-17 (×4): 81 mg via ORAL
  Filled 2014-06-14 (×5): qty 1

## 2014-06-14 MED ORDER — POTASSIUM CHLORIDE CRYS ER 20 MEQ PO TBCR
40.0000 meq | EXTENDED_RELEASE_TABLET | Freq: Two times a day (BID) | ORAL | Status: AC
  Administered 2014-06-14 – 2014-06-15 (×3): 40 meq via ORAL
  Filled 2014-06-14 (×4): qty 2

## 2014-06-14 MED ORDER — VANCOMYCIN HCL 10 G IV SOLR
1250.0000 mg | INTRAVENOUS | Status: DC
  Administered 2014-06-14 – 2014-06-15 (×2): 1250 mg via INTRAVENOUS
  Filled 2014-06-14 (×4): qty 1250

## 2014-06-14 MED ORDER — PIPERACILLIN-TAZOBACTAM 3.375 G IVPB
3.3750 g | Freq: Three times a day (TID) | INTRAVENOUS | Status: DC
  Administered 2014-06-14 – 2014-06-16 (×7): 3.375 g via INTRAVENOUS
  Filled 2014-06-14 (×8): qty 50

## 2014-06-14 MED ORDER — ADULT MULTIVITAMIN W/MINERALS CH
1.0000 | ORAL_TABLET | Freq: Every day | ORAL | Status: DC
  Administered 2014-06-14 – 2014-06-18 (×5): 1 via ORAL
  Filled 2014-06-14 (×6): qty 1

## 2014-06-14 MED ORDER — DEXTROSE-NACL 5-0.2 % IV SOLN
INTRAVENOUS | Status: DC
  Administered 2014-06-14: 06:00:00 via INTRAVENOUS

## 2014-06-14 MED ORDER — HYDRALAZINE HCL 20 MG/ML IJ SOLN: 5.0000 mg | Freq: Four times a day (QID) | INTRAMUSCULAR | Status: DC | PRN

## 2014-06-14 MED ORDER — RESOURCE THICKENUP CLEAR PO POWD
ORAL | Status: DC | PRN
Start: 2014-06-14 — End: 2014-06-18
  Filled 2014-06-14: qty 125

## 2014-06-14 MED ORDER — AMLODIPINE BESYLATE 10 MG PO TABS
10.0000 mg | ORAL_TABLET | Freq: Every day | ORAL | Status: DC
  Administered 2014-06-14 – 2014-06-18 (×5): 10 mg via ORAL
  Filled 2014-06-14 (×7): qty 1

## 2014-06-14 MED ORDER — BOOST / RESOURCE BREEZE PO LIQD
1.0000 | ORAL | Status: DC
  Administered 2014-06-16 – 2014-06-18 (×2): 1 via ORAL

## 2014-06-14 MED ORDER — QUETIAPINE FUMARATE 100 MG PO TABS
100.0000 mg | ORAL_TABLET | Freq: Every day | ORAL | Status: DC
  Administered 2014-06-14 – 2014-06-17 (×4): 100 mg via ORAL
  Filled 2014-06-14 (×5): qty 1

## 2014-06-14 MED ORDER — METOPROLOL TARTRATE 1 MG/ML IV SOLN
5.0000 mg | Freq: Four times a day (QID) | INTRAVENOUS | Status: DC
  Administered 2014-06-14 – 2014-06-15 (×5): 5 mg via INTRAVENOUS
  Filled 2014-06-14 (×6): qty 5

## 2014-06-14 NOTE — Consult Note (Signed)
WOC wound consult note Reason for Consult: evaluate heel ulcers. Pt will not open her eyes but will respond to some of my questions with yes or no.  Wound type: sDTI (suspected deep tissue injury) ulcer left heel; sDTI (suspected deep tissue injury) right heel Pressure Ulcer POA: Yes x 2 Measurement: Left heel: 4cm x 5cm x 0 Right heel: 4cm x 3cm x 0 Wound bed: Left heel: mostly dark intact skin over the left heel, minimally fluctuant no drainage.  Right heel: appears was larger intact blister but has reabsorbed, lateral edge of the wound is dark, black /maroon tissue, mildly fluctuant  Drainage (amount, consistency, odor) none Periwound:intact Dressing procedure/placement/frequency: Soft silicone foam to protect these ulcers, float heels at all times until Prevalon boots arrive to unit for use.   Discussed POC bedside nurse.  Re consult if needed, will not follow at this time. Thanks  Melody Foot Lockerustin RN, CWOCN [phone])

## 2014-06-14 NOTE — Progress Notes (Signed)
INITIAL NUTRITION ASSESSMENT  DOCUMENTATION CODES Per approved criteria  -Not Applicable   INTERVENTION: Provide Ensure Pudding BID in between meals Provide Magic Cup ice cream BID, with lunch and dinner Provide Resource Breeze, thickened once daily with breakfast Provide Multivitamin with minerals daily  NUTRITION DIAGNOSIS: Predicted suboptimal energy intake related to dementia, SIRS, and dysphagia as evidenced by pt's chart.   Goal: Pt to meet >/= 90% of their estimated nutrition needs   Monitor:  PO intake, weight trend, labs  Reason for Assessment: Consult for malnutrition  80 y.o. female  Admitting Dx: <principal problem not specified>  ASSESSMENT: 80 y.o. female presenting with SIRS, hypernatremia and AMS. PMH is significant for Bipolar disorder, dementia and hypertension. Per nurse and chart review, patient recently in ED 5 weeks ago was manic and transported to [facility]. After returning to SNF, she was less responsive and had decreased PO.   Pt not able to provided history at time of visit, simply stated repeatedly "No, he hasn't been eating". Pt had SLP evaluation today and diet was advanced to dysphagia 1 with nectar-thick liquids. No weight history on file to assess weight loss.  Labs reviewed: high sodium, low potassium, low hemoglobin  Nutrition Focused Physical Exam:  Subcutaneous Fat:  Orbital Region: wnl Upper Arm Region: wnl Thoracic and Lumbar Region: NA  Muscle:  Temple Region: mild wasting Clavicle Bone Region: wnl Clavicle and Acromion Bone Region: mild wasting Scapular Bone Region: NA Dorsal Hand: mild wasting Patellar Region: wnl Anterior Thigh Region: wnl Posterior Calf Region: mild wasting  Edema: none   Height: Ht Readings from Last 1 Encounters:  06/13/14 5\' 4"  (1.626 m)    Weight: Wt Readings from Last 1 Encounters:  06/14/14 148 lb 13 oz (67.5 kg)    Ideal Body Weight: 120 lbs  % Ideal Body Weight: 123%  Wt  Readings from Last 10 Encounters:  06/14/14 148 lb 13 oz (67.5 kg)    Usual Body Weight: unknown  % Usual Body Weight: NA  BMI:  Body mass index is 25.53 kg/(m^2).  Estimated Nutritional Needs: Kcal: 1500-1750 Protein: 70-80 grams Fluid: 1.5-1.7 L/day  Skin: intact  Diet Order: Dysphagia  EDUCATION NEEDS: -No education needs identified at this time   Intake/Output Summary (Last 24 hours) at 06/14/14 0957 Last data filed at 06/14/14 [id number]  Gross per 24 hour  Intake   2504 ml  Output    826 ml  Net   1678 ml    Last BM: 6/26   Labs:   Recent Labs Lab 06/14/14 0010 06/14/14 0320 06/14/14 0809  NA 158* 157* 157*  K 3.7 3.3* 3.4*  CL 116* 114* 116*  CO2 30 31 30   BUN 39* 37* 33*  CREATININE 0.72 0.64 0.62  CALCIUM 8.5 8.7 8.6  GLUCOSE 110* 113* 133*    CBG (last 3)  No results found for this basename: GLUCAP,  in the last 72 hours  Scheduled Meds: . heparin  5,000 Units Subcutaneous 3 times per day  . hydrochlorothiazide  25 mg Oral Daily  . metoprolol  5 mg Intravenous 4 times per day  . piperacillin-tazobactam (ZOSYN)  IV  3.375 g Intravenous Q8H  . sodium chloride  3 mL Intravenous Q12H  . valproate sodium  250 mg Intravenous 4 times per day    Continuous Infusions: . dextrose 5 % and 0.2 % NaCl 100 mL/hr at 06/14/14 [id number]    Past Medical History  Diagnosis Date  . Alzheimer disease   .  Dementia   . Bipolar 1 disorder   . Hypertension     Past Surgical History  Procedure Laterality Date  . Cholecystectomy    . Abdominal hysterectomy      partial    Ian Malkineanne Barnett RD, LDN Inpatient Clinical Dietitian Pager: [phone] After Hours Pager: [phone]

## 2014-06-14 NOTE — Progress Notes (Signed)
Utilization Review Completed.Dowell, Jenny Patterson  

## 2014-06-14 NOTE — Progress Notes (Addendum)
ANTIBIOTIC CONSULT NOTE - INITIAL  Pharmacy Consult for Zosyn + Vancomycin Indication: r/o aspiration PNA & B/L LE cellulitis/wound/ulcers  No Known Allergies  Patient Measurements: Height: 5\' 4"  (162.6 cm) Weight: 148 lb 13 oz (67.5 kg) IBW/kg (Calculated) : 54.7  Vital Signs: Temp: 98.1 F (36.7 C) (06/26 0631) Temp src: Oral (06/26 0631) BP: 167/70 mmHg (06/26 0631) Pulse Rate: 100 (06/26 0631) Intake/Output from previous day: 06/25 0701 - 06/26 0700 In: 2504 [I.V.:1700; IV Piggyback:104] Out: 826 [Urine:825; Stool:1] Intake/Output from this shift:    Labs:  Recent Labs  06/13/14 1220 06/13/14 2000 06/14/14 0010 06/14/14 0320  WBC 28.2*  --   --  26.2*  HGB 12.1  --   --  10.7*  PLT 523*  --   --  427*  CREATININE 0.97 0.78 0.72 0.64   Estimated Creatinine Clearance: 48.5 ml/min (by C-G formula based on Cr of 0.64). No results found for this basename: VANCOTROUGH, VANCOPEAK, VANCORANDOM, GENTTROUGH, GENTPEAK, GENTRANDOM, TOBRATROUGH, TOBRAPEAK, TOBRARND, AMIKACINPEAK, AMIKACINTROU, AMIKACIN,  in the last 72 hours   Microbiology: No results found for this or any previous visit (from the past 720 hour(s)).  Medical History: Past Medical History  Diagnosis Date  . Alzheimer disease   . Dementia   . Bipolar 1 disorder   . Hypertension     Assessment: [age] [facility] - pharmacy consulted to start Zosyn for concerns of aspiration. Tmax/24h: 100.6, WBC 26.2, SCr 0.64, CrCl~40-50 ml/min. No antibiotics have been administered yet this admission.   Goal of Therapy:  Proper antibiotics for infection/cultures adjusted for renal/hepatic function   Plan:  1. Zosyn 3.375g IV every 8 hours (infused over 4 hours) 2. Will continue to follow renal function, culture results, LOT, and antibiotic de-escalation plans   Georgina PillionElizabeth Alberta Lenhard, PharmD, BCPS Clinical Pharmacist Pager: [phone] 06/14/2014 8:24 AM     --------------------------------------------------------------------------------------- Addendum  Pharmacy also consulted to start Vancomycin for B/L LE cellulitis/wounds/pressure ulcers. X-rays were negative for osteo.   Plan 1. Vancomcyin 1250 mg IV every 24 hours 2. Will continue to follow renal function, culture results, LOT, and antibiotic de-escalation plans   Georgina PillionElizabeth Cledis Sohn, PharmD, BCPS Clinical Pharmacist Pager: [phone] 06/14/2014 3:05 PM

## 2014-06-14 NOTE — Evaluation (Addendum)
Clinical/Bedside Swallow Evaluation Patient Details  Name: Jenny Patterson MRN: [id number] Date of Jenny PelBirth: 12/17/1927  Today's Date: 06/14/2014 Time: [id number] SLP Time Calculation (min): 25 min  Past Medical History:  Past Medical History  Diagnosis Date  . Alzheimer disease   . Dementia   . Bipolar 1 disorder   . Hypertension    Past Surgical History:  Past Surgical History  Procedure Laterality Date  . Cholecystectomy    . Abdominal hysterectomy      partial   HPI:  Jenny Patterson is a 80 y.o. female presenting with SIRS, hypernatremia and AMS. PMH is significant for Bipolar disorder, dementia and hypertension, admitted for altered mental status. Patient febrile, tachycardic with elevated white count and tachypnea. No source of infection found on initial evaluation. Chest x-ray, CT abdomen and UA with no signs of infection. Also presenting with hypernatremia (possibly d/t decreased oral intake of fluids) and acute encephalopathy.    Assessment / Plan / Recommendation Clinical Impression  Pt demonstrated overt s/s of aspiration with about 50% of thin liquid trials of immediate throat clear, and swallowed 3-4 times with small sips. Altered mental status negatively impacted sustained attention to presentation of boluses. Nectar thick liquid presented via both cup and straw. No s/s of aspiration noted with this consistency. Pt seems to prefer drinking from straw and may have better preparation for bolus via straw as she was not able to control cup sips independently. Pt is at high risk of aspiration with thin liquids. Recommending initiating dysphagia 1 diet with nectar thick liquids (straws ok), meds crushed in puree. Suspect improvements in swallow function as encephalopathy status improves and hopefully increased sustained attn to meal. ST will continue to follow for diet tolerance and advancement.    Aspiration Risk  Moderate    Diet Recommendation Dysphagia 1 (Puree);Nectar-thick liquid    Liquid Administration via: Cup;Straw Medication Administration: Crushed with puree Supervision: Staff to assist with self feeding;Full supervision/cueing for compensatory strategies Compensations: Slow rate;Small sips/bites Postural Changes and/or Swallow Maneuvers: Seated upright 90 degrees;Upright 30-60 min after meal    Other  Recommendations Oral Care Recommendations: Oral care BID   Follow Up Recommendations  Skilled Nursing facility    Frequency and Duration min 2x/week  2 weeks   Pertinent Vitals/Pain n/a    SLP Swallow Goals     Swallow Study Prior Functional Status       General HPI: Jenny Patterson is a 80 y.o. female presenting with SIRS, hypernatremia and AMS. PMH is significant for Bipolar disorder, dementia and hypertension, admitted for altered mental status. Patient febrile, tachycardic with elevated white count and tachypnea. No source of infection found on initial evaluation. Chest x-ray, CT abdomen and UA with no signs of infection. Also presenting with hypernatremia (possibly d/t decreased oral intake of fluids) and acute encephalopathy.  Type of Study: Bedside swallow evaluation Diet Prior to this Study: NPO Temperature Spikes Noted: Yes Respiratory Status: Room air History of Recent Intubation: No Behavior/Cognition: Alert;Requires cueing;Doesn't follow directions Oral Cavity - Dentition: Poor condition;Missing dentition Self-Feeding Abilities: Needs assist Patient Positioning: Upright in bed Baseline Vocal Quality: Breathy Volitional Cough: Cognitively unable to elicit Volitional Swallow: Unable to elicit    Oral/Motor/Sensory Function Overall Oral Motor/Sensory Function: Other (comment) (unable to fully assess- unable to follow directions)   Ice Chips Ice chips: Not tested   Thin Liquid Thin Liquid: Impaired Presentation: Cup;Straw Oral Phase Functional Implications: Prolonged oral transit Pharyngeal  Phase Impairments: Suspected delayed  Swallow;Multiple swallows;Throat Clearing - Immediate  Nectar Thick Nectar Thick Liquid: Within functional limits Presentation: Straw;Cup   Honey Thick Honey Thick Liquid: Not tested   Puree Puree: Impaired Presentation: Spoon Oral Phase Impairments: Reduced lingual movement/coordination;Impaired anterior to posterior transit Oral Phase Functional Implications: Prolonged oral transit Pharyngeal Phase Impairments: Multiple swallows;Suspected delayed Swallow   Solid   GO    Solid: Not tested       [place], Meeya Goldin K, MA, CCC-SLP 06/14/2014,9:13 AM

## 2014-06-14 NOTE — Progress Notes (Signed)
Family Medicine Teaching Service Daily Progress Note Intern Pager: [phone]  Patient name: Jenny Patterson Medical record number: [id number] Date of birth: 20-Jun-1927 Age: 80 y.o. Gender: female  Primary Care Provider: No PCP Per Patient Consultants: none Code Status: Full  Pt Overview and Major Events to Date:  6/25: Admitted, febrile, WBC 28.2, Na 159 (1/2 NS), cultures pending, no identified source CXR neg 6/26: Zosyn (6/26 >>), Na 157 (1/4 NS), start D5-W x 24 hrs, BMET q 4, SLP-Dys1 diet, inc Metoprolol  Assessment and Plan: Jenny PelRuby Slaght is a 80 y.o. female presenting (directly from [facility]) with SIRS, hypernatremia and AMS. PMH is significant for Bipolar disorder, dementia and hypertension  # SIRS, with unknown infectious source, concern for aspiration PNA - Improved - On admission, febrile to 100.72F, tachycardic, tachypnea, no O2 requirement (>98% on RA), acute encephalopathy - Work-up with CXR Portable (neg, no infiltrate), CT Abd (negative for infection) - Possible source bilateral feet pressure ulcer skin changes, no open wound / ulcer, R > L with skin breakdown - Currently with improved mental status, afebrile x 24 hours, BP elevated (no hypotension), AFib with tachycardia, also note 1x large loose stool with foul smell - Telemetry - follow-up blood and urine cultures - f/u fever curve - Add C. Diff PCR to stool - Elevated WBC 28.2 >> 26.2 - Trend CBC - Started Zosyn IV per pharm (6/26 >>), broad coverage including anaerobes for potential aspiration - Consider adding Vancomycin IV for staph if possible skin / osteo - Ordered X-ray bilateral feet, to r/o underlying infection / osteo - Consult WOC  # Hypernatremia - Admission Na 159, likely decreased PO intake, dehydration - Initially on D5 1/2NS >> with mild improvement, transitioned to D5 1/4 NS - Na stable at 157 - Switch to D5-Water @ 80 cc/hr x 24 hours to correct half of 4 L water deficit - frequent BMET q 4  hr, monitor Na - Now taking PO, anticipate to continue improve  # Acute encephalopathy - Improved - on admission, GCS 11 (Eye: 3, Verbal: 2, Motor: 6). Most likely caused by hypernatremia vs sedation from psych meds vs possible infection (although only meets SIRS criteria because no source of infection)  - neuro checks q4hrs - Passed swallow eval SLP >> Dysphagia 1 diet with nectar thick, crushed meds - Currently mental status improved, awake and alert, responds / follows commands, some appropriate responses, otherwise frequently repeating same phrase to most questions - Ordered Head CT (to f/u recent evaluation in ED on 6/11) with persistent AMS, and concern for prior CVA, meningioma?, plan to rule out delayed subdural hematoma or acute bleed  # Atrial Fibrillation, unknown onset / duration - Initially RVR, not on EKG, but confirmed on telemetry and exam - Persistently in AFib today, rate controlled on Metoprolol - Start ASA 81mg  daily - Repeat EKG  # Bipolar disorder/psych: on Depakote, Effexor, Seroquel, Trazadone, ativan and Haldol  - Continue Depakote - Continue haldol and ativan as PRN for agitation only  - Resume Seroquel 100mg  PO (crushed) tonight given improved mental status, consider adding others daily - Hold Effexor and Trazadone to reduce sedation effects  # Hypertension:  - Persistently elevated BP, SBP >160s - Resume Amlodipine 10mg  daily and HCTZ 25mg  daily, (crushed passed swallow eval) - Increased Metoprolol IV to 5mg  q 6 hr (per pharm, equivalent to home PO dose) - hydralazine 5mg  IV PRN for SBP >180  - monitor vitals  # Hypokalemia: - 3.5 >> 3.7 >> 3.4 -  replete as needed, f/u BMET  FEN/GI: Dysphagia 1 diet / nectar thick, crush meds / D5-Water @ 80 cc/hr x 24 hrs  Prophylaxis: Heparin subq  Disposition: Admit to inpatient telemetry, monitoring mental status, correcting Na, work-up for potential infection  Subjective:  Resting in bed, responds appropriately  to some questions. States feels "good", and "better" than when she came in. Otherwise repeats same phrase over to most questions, referring to "out" and "fell".  Objective: Temp:  [97.4 F (36.3 C)-100 F (37.8 C)] 97.7 F (36.5 C) (06/26 1036) Pulse Rate:  [76-121] 92 (06/26 1036) Resp:  [18-27] 19 (06/26 1036) BP: (163-183)/(48-111) 168/93 mmHg (06/26 1036) SpO2:  [91 %-100 %] 100 % (06/26 1036) Weight:  [148 lb 13 oz (67.5 kg)-156 lb 9.6 oz (71.033 kg)] 148 lb 13 oz (67.5 kg) (06/26 0700) Physical Exam: Gen: resting in bed, calm and comfortable, NAD HEENT: NCAT, PERRL, EOMI, oropharynx clear, mild dry MM Resp: CTAB, no focal crackles, generalized decreased breath sounds with poor resp effort on exam, no resp distress Heart: irregularly irregular, rate controlled, no murmur Abd: Soft, NT/ND. BS+ and normal.  Ext: LLE: heel blister / pressure (non-open), RLE - lateral darkened skin with evidence of early breakdown without erythema or open ulcer. LE non-tender no edema Neuro: awake, alert, disoriented (name only), follows most commands, limited verbal responses some appropriate, otherwise not cooperative with exam  Laboratory:  Recent Labs Lab 06/13/14 1220 06/14/14 0320  WBC 28.2* 26.2*  HGB 12.1 10.7*  HCT 37.9 34.5*  PLT 523* 427*    Recent Labs Lab 06/13/14 1220  06/14/14 0010 06/14/14 0320 06/14/14 0809  NA 157*  < > 158* 157* 157*  K 3.6*  < > 3.7 3.3* 3.4*  CL 112  < > 116* 114* 116*  CO2 32  < > 30 31 30   BUN 50*  < > 39* 37* 33*  CREATININE 0.97  < > 0.72 0.64 0.62  CALCIUM 9.2  < > 8.5 8.7 8.6  PROT 6.3  --   --   --   --   BILITOT 0.3  --   --   --   --   ALKPHOS 80  --   --   --   --   ALT 19  --   --   --   --   AST 24  --   --   --   --   GLUCOSE 167*  < > 110* 113* 133*  < > = values in this interval not displayed. Lactic Acid - 1.91 TSH - 2.680  Imaging/Diagnostic Tests:  (Previous CT Head 05/30/14 - recent ED visit) IMPRESSION:  Chronic  atrophy and small vessel ischemic changes. Suggestion of  focal acute infarct in the left posterior temporal region. No  significant mass effect. No acute intracranial hemorrhage. Stable  appearance of calcified extra-axial lesion in the left posterior  cranial fossa, possibly meningioma.  6/25 CXR Portable 1v IMPRESSION:  No active disease. Chest is stable from prior exam.  6/25 CT Abd IMPRESSION:  Mild apparent right hydronephrosis and extra renal pelvis which may  be better characterized with ultrasound as clinically indicated.  Foley catheter decompresses urinary bladder.  Large amount of stool projecting at rectal vault without bowel  obstruction.  EKG: sinus arrythmia and tachycardia  Saralyn PilarAlexander Anabia Weatherwax, DO 06/14/2014, 12:23 PM PGY-1, [facility] FPTS Intern pager: [phone], text pages welcome

## 2014-06-14 NOTE — Progress Notes (Signed)
FMTS ATTENDING  NOTE Jenny Eniola,MD I  have seen and examined this patient, reviewed their chart. I have discussed this patient with the resident. I agree with the resident's findings, assessment and care plan.Patient improved significantly from yesterday, she is more communicative even though she seem to keep repeating the same word. When asked how is she doing she stated she is fine. She has been afebrile for more than 24 hrs, WBC trending down, blood culture negative so far. Na level is gradually trending down. Heart rhythm remains irregular,rate however well controlled on Metoprolol, I agree with ASA 81 mg daily, anticoagulant not recommended due to age and risk for fall. Repeat EKG. Patient cardiac status seem stable but if this changes we will consult cardiology.

## 2014-06-15 ENCOUNTER — Encounter (HOSPITAL_COMMUNITY): Payer: Self-pay | Admitting: General Practice

## 2014-06-15 LAB — BASIC METABOLIC PANEL
BUN: 27 mg/dL — ABNORMAL HIGH (ref 6–23)
BUN: 26 mg/dL — ABNORMAL HIGH (ref 6–23)
BUN: 25 mg/dL — ABNORMAL HIGH (ref 6–23)
BUN: 25 mg/dL — ABNORMAL HIGH (ref 6–23)
CALCIUM: 8.2 mg/dL — AB (ref 8.4–10.5)
CHLORIDE: 115 meq/L — AB (ref 96–112)
CO2: 31 mEq/L (ref 19–32)
CO2: 31 mEq/L (ref 19–32)
CO2: 31 mEq/L (ref 19–32)
CO2: 30 mEq/L (ref 19–32)
CREATININE: 0.72 mg/dL (ref 0.50–1.10)
Calcium: 8.2 mg/dL — ABNORMAL LOW (ref 8.4–10.5)
Calcium: 8.1 mg/dL — ABNORMAL LOW (ref 8.4–10.5)
Calcium: 8 mg/dL — ABNORMAL LOW (ref 8.4–10.5)
Chloride: 117 mEq/L — ABNORMAL HIGH (ref 96–112)
Chloride: 116 mEq/L — ABNORMAL HIGH (ref 96–112)
Chloride: 115 mEq/L — ABNORMAL HIGH (ref 96–112)
Creatinine, Ser: 0.71 mg/dL (ref 0.50–1.10)
Creatinine, Ser: 0.71 mg/dL (ref 0.50–1.10)
Creatinine, Ser: 0.74 mg/dL (ref 0.50–1.10)
GFR calc Af Amer: 89 mL/min — ABNORMAL LOW (ref >90)
GFR calc Af Amer: 87 mL/min — ABNORMAL LOW (ref >90)
GFR calc non Af Amer: 76 mL/min — ABNORMAL LOW (ref >90)
GFR calc non Af Amer: 76 mL/min — ABNORMAL LOW (ref >90)
GFR, EST AFRICAN AMERICAN: 89 mL/min — AB (ref >90)
GFR, EST AFRICAN AMERICAN: 88 mL/min — AB (ref >90)
GFR, EST NON AFRICAN AMERICAN: 76 mL/min — AB (ref >90)
GFR, EST NON AFRICAN AMERICAN: 75 mL/min — AB (ref >90)
GLUCOSE: 97 mg/dL (ref 70–99)
Glucose, Bld: 138 mg/dL — ABNORMAL HIGH (ref 70–99)
Glucose, Bld: 111 mg/dL — ABNORMAL HIGH (ref 70–99)
Glucose, Bld: 99 mg/dL (ref 70–99)
POTASSIUM: 3.4 meq/L — AB (ref 3.7–5.3)
POTASSIUM: 3.3 meq/L — AB (ref 3.7–5.3)
Potassium: 3.2 mEq/L — ABNORMAL LOW (ref 3.7–5.3)
Potassium: 4 mEq/L (ref 3.7–5.3)
SODIUM: 158 meq/L — AB (ref 137–147)
Sodium: 157 mEq/L — ABNORMAL HIGH (ref 137–147)
Sodium: 158 mEq/L — ABNORMAL HIGH (ref 137–147)
Sodium: 155 mEq/L — ABNORMAL HIGH (ref 137–147)

## 2014-06-15 LAB — CBC WITH DIFFERENTIAL/PLATELET
BASOS ABS: 0 10*3/uL (ref 0.0–0.1)
Basophils Relative: 0 % (ref 0–1)
Eosinophils Absolute: 0.1 10*3/uL (ref 0.0–0.7)
Eosinophils Relative: 1 % (ref 0–5)
HCT: 32.5 % — ABNORMAL LOW (ref 36.0–46.0)
Hemoglobin: 9.9 g/dL — ABNORMAL LOW (ref 12.0–15.0)
Lymphocytes Relative: 11 % — ABNORMAL LOW (ref 12–46)
Lymphs Abs: 2.1 10*3/uL (ref 0.7–4.0)
MCH: 30.6 pg (ref 26.0–34.0)
MCHC: 30.5 g/dL (ref 30.0–36.0)
MCV: 100.3 fL — ABNORMAL HIGH (ref 78.0–100.0)
Monocytes Absolute: 1.9 10*3/uL — ABNORMAL HIGH (ref 0.1–1.0)
Monocytes Relative: 10 % (ref 3–12)
NEUTROS ABS: 15.1 10*3/uL — AB (ref 1.7–7.7)
Neutrophils Relative %: 78 % — ABNORMAL HIGH (ref 43–77)
Platelets: 373 10*3/uL (ref 150–400)
RBC: 3.24 MIL/uL — ABNORMAL LOW (ref 3.87–5.11)
RDW: 16.3 % — AB (ref 11.5–15.5)
WBC: 19.2 10*3/uL — AB (ref 4.0–10.5)

## 2014-06-15 LAB — OSMOLALITY, URINE: OSMOLALITY UR: 744 mosm/kg (ref 390–1090)

## 2014-06-15 LAB — SODIUM, URINE, RANDOM: SODIUM UR: 45 meq/L

## 2014-06-15 MED ORDER — METOPROLOL TARTRATE 1 MG/ML IV SOLN
10.0000 mg | Freq: Four times a day (QID) | INTRAVENOUS | Status: DC
  Administered 2014-06-15 – 2014-06-16 (×3): 10 mg via INTRAVENOUS
  Filled 2014-06-15 (×5): qty 10

## 2014-06-15 MED ORDER — SODIUM CHLORIDE 0.9 % IV BOLUS (SEPSIS)
1000.0000 mL | Freq: Once | INTRAVENOUS | Status: AC
  Administered 2014-06-15: 1000 mL via INTRAVENOUS

## 2014-06-15 MED ORDER — DEXTROSE 5 % IV SOLN
INTRAVENOUS | Status: AC
  Administered 2014-06-16 (×2): via INTRAVENOUS

## 2014-06-15 NOTE — Progress Notes (Signed)
Family Medicine Teaching Service Daily Progress Note Intern Pager: [phone]  Patient name: Jenny PelRuby Kisling Medical record number: [id number] Date of birth: November 04, 1927 Age: 80 y.o. Gender: female  Primary Care Jeriyah Granlund: No PCP Per Patient Consultants: none Code Status: Full  Pt Overview and Major Events to Date:  6/25: Admitted, febrile, WBC 28.2, Na 159 (1/2 NS), cultures pending, no identified source CXR neg 6/26: Zosyn (6/26 >>), Na 157 (1/4 NS), start D5-W x 24 hrs, BMET q 4, SLP-Dys1 diet, inc Metoprolol  Assessment and Plan: Jenny Patterson is a 80 y.o. female presenting (directly from [facility]) with SIRS, hypernatremia and AMS. PMH is significant for Bipolar disorder, dementia and hypertension  # SIRS, with unknown infectious source, concern for aspiration PNA - Improved, unclear infectious source - On admission, febrile to 100.21F, tachycardic, tachypnea, no O2 requirement (>98% on RA), acute encephalopathy - Work-up with CXR Portable (neg, no infiltrate), CT Abd (negative for infection), XR BL feet (neg for osteo) - Possible source bilateral feet pressure ulcer skin changes, no open wound / ulcer, R > L with skin breakdown - Currently with improved mental status, afebrile x 24 hours, BP elevated (no hypotension), AFib with tachycardia, also note 1x large loose stool with foul smell which is Cdiff negative - cont Telemetry - blood and urine cultures ngtd - f/u fever curve - Add C. Diff PCR to stool - Elevated WBC 28.2 >> 26.2>>19.2 - Trend CBC - Started Zosyn IV per pharm (6/26 >>), broad coverage including anaerobes for potential aspiration - Consider adding Vancomycin IV for staph if possible skin / osteo - Consult WOC  # Hypernatremia - Admission Na 159, likely decreased PO intake, dehydration, has recent Hx of some loose stools.  - Initially on D5 1/2NS >> with mild improvement, transitioned to D5 1/4 NS then to D5 W - Na stable at 158 - Consider DI, despite no obvious  polyuria (she is incontinent) - Place foley for strict I/O, check urine sodium and osmol - Continue D5 W at 100, Consider 1 L bolus NS,  - Space Na to Q8 hours - DC HCTZ and increase metoprolol - Now taking PO, anticipate to continue improve  # Acute encephalopathy - stable - on admission, GCS 11 (Eye: 3, Verbal: 2, Motor: 6). Most likely caused by hypernatremia vs sedation from psych meds vs possible infection (although only meets SIRS criteria because no source of infection)  - neuro checks q4hrs - Passed swallow eval SLP >> Dysphagia 1 diet with nectar thick, crushed meds - Currently mental status improved, awake and alert, responds / follows commands, some appropriate responses, otherwise frequently repeating same phrase to most questions - Ordered Head CT (to f/u recent evaluation in ED on 6/11) with persistent AMS, and concern for prior CVA, meningioma?, plan to rule out delayed subdural hematoma or acute bleed Consider MRI with question of meningioma  # Atrial Fibrillation, unknown onset / duration - Initially RVR, not on EKG, but confirmed on telemetry and exam -sinus tach surrently, increase metop to 10 Q6  - Start ASA 81mg  daily - Repeat EKG  # Bipolar disorder/psych: on Depakote, Effexor, Seroquel, Trazadone, ativan and Haldol  - Continue Depakote - Continue haldol and ativan as PRN for agitation only  - Resume Seroquel 100mg  PO (crushed) tonight given improved mental status, consider adding others daily - Hold Effexor and Trazadone to reduce sedation effects - Consider holding seroquel again given less talkative today than previous.   # Hypertension:  - Persistently elevated BP,  SBP >160s - Resume Amlodipine 10mg  daily, (crushed passed swallow eval) - hold hctz, increase metop to 10 Q 6 - hydralazine 5mg  IV PRN for SBP >180  - monitor vitals  # Hypokalemia: - 3.5 >> 3.7 >> 3.4 - replete as needed, f/u BMET tomorrow  FEN/GI: Dysphagia 1 diet / nectar thick, crush meds  / D5-Water @ 100 cc/hr x 24 hrs  Prophylaxis: Heparin subq  Disposition: Admit to inpatient telemetry, monitoring mental status, correcting Na, work-up for potential infection  Subjective:  Resting in bed, only responds to unpleasent stimuli.   Objective: Temp:  [98.1 F (36.7 C)-99.5 F (37.5 C)] 99.5 F (37.5 C) (06/27 1001) Pulse Rate:  [71-110] 110 (06/27 1001) Resp:  [17-19] 17 (06/27 1001) BP: (100-153)/(62-85) 153/62 mmHg (06/27 1001) SpO2:  [93 %-100 %] 94 % (06/27 1001) Weight:  [151 lb 3.4 oz (68.59 kg)] 151 lb 3.4 oz (68.59 kg) (06/26 2300) Physical Exam: Gen: resting in bed, calm and comfortable, NAD HEENT: NCAT, PERRL Resp: CTAB, no focal crackles, generalized decreased breath sounds with poor resp effort on exam, no resp distress Heart: tachy, regular rate, no murmur Abd: Soft, NT/ND. BS+ and normal.  Ext: LLE: heel blister / pressure (non-open), RLE - lateral darkened skin with evidence of early breakdown without erythema or open ulcer. LE non-tender no edema Neuro: awake, alert, responds to unpleasant stimuli, normal tone throughout  Laboratory:  Recent Labs Lab 06/13/14 1220 06/14/14 0320 06/15/14 0429  WBC 28.2* 26.2* 19.2*  HGB 12.1 10.7* 9.9*  HCT 37.9 34.5* 32.5*  PLT 523* 427* 373    Recent Labs Lab 06/13/14 1220  06/15/14 0102 06/15/14 0429 06/15/14 0810  NA 157*  < > 158* 157* 158*  K 3.6*  < > 3.4* 3.2* 3.3*  CL 112  < > 117* 115* 116*  CO2 32  < > 31 31 31   BUN 50*  < > 27* 26* 25*  CREATININE 0.97  < > 0.71 0.72 0.71  CALCIUM 9.2  < > 8.2* 8.1* 8.0*  PROT 6.3  --   --   --   --   BILITOT 0.3  --   --   --   --   ALKPHOS 80  --   --   --   --   ALT 19  --   --   --   --   AST 24  --   --   --   --   GLUCOSE 167*  < > 138* 111* 99  < > = values in this interval not displayed. Lactic Acid - 1.91 TSH - 2.680  Imaging/Diagnostic Tests:  (Previous CT Head 05/30/14 - recent ED visit) IMPRESSION:  Chronic atrophy and small vessel  ischemic changes. Suggestion of  focal acute infarct in the left posterior temporal region. No  significant mass effect. No acute intracranial hemorrhage. Stable  appearance of calcified extra-axial lesion in the left posterior  cranial fossa, possibly meningioma.  6/25 CXR Portable 1v IMPRESSION:  No active disease. Chest is stable from prior exam.  6/25 CT Abd IMPRESSION:  Mild apparent right hydronephrosis and extra renal pelvis which may  be better characterized with ultrasound as clinically indicated.  Foley catheter decompresses urinary bladder.  Large amount of stool projecting at rectal vault without bowel  obstruction.  06/14/2014 BL foot XR IMPRESSION:  Diffuse osteopenia. No acute fracture or subluxation. No evidence of  osteomyelitis.   Elenora GammaSamuel L Bradshaw, MD 06/15/2014, 10:53 AM PGY-2,   Family Medicine FPTS Intern pager: [phone], text pages welcome

## 2014-06-15 NOTE — Progress Notes (Signed)
FMTS ATTENDING  NOTE Kehinde Eniola,MD I  have seen and examined this patient, reviewed their chart. I have discussed this patient with the resident. I agree with the resident's findings, assessment and care plan. 

## 2014-06-16 ENCOUNTER — Inpatient Hospital Stay (HOSPITAL_COMMUNITY): Payer: Medicare Other

## 2014-06-16 DIAGNOSIS — N179 Acute kidney failure, unspecified: Secondary | ICD-10-CM | POA: Diagnosis not present

## 2014-06-16 DIAGNOSIS — R6510 Systemic inflammatory response syndrome (SIRS) of non-infectious origin without acute organ dysfunction: Secondary | ICD-10-CM | POA: Diagnosis not present

## 2014-06-16 DIAGNOSIS — G9340 Encephalopathy, unspecified: Secondary | ICD-10-CM | POA: Diagnosis not present

## 2014-06-16 DIAGNOSIS — E870 Hyperosmolality and hypernatremia: Secondary | ICD-10-CM | POA: Diagnosis not present

## 2014-06-16 DIAGNOSIS — E860 Dehydration: Secondary | ICD-10-CM

## 2014-06-16 LAB — BASIC METABOLIC PANEL
BUN: 23 mg/dL (ref 6–23)
BUN: 19 mg/dL (ref 6–23)
BUN: 18 mg/dL (ref 6–23)
CALCIUM: 8.1 mg/dL — AB (ref 8.4–10.5)
CALCIUM: 7.8 mg/dL — AB (ref 8.4–10.5)
CO2: 29 mEq/L (ref 19–32)
CO2: 25 mEq/L (ref 19–32)
CO2: 27 mEq/L (ref 19–32)
Calcium: 8 mg/dL — ABNORMAL LOW (ref 8.4–10.5)
Chloride: 113 mEq/L — ABNORMAL HIGH (ref 96–112)
Chloride: 110 mEq/L (ref 96–112)
Chloride: 107 mEq/L (ref 96–112)
Creatinine, Ser: 0.74 mg/dL (ref 0.50–1.10)
Creatinine, Ser: 0.66 mg/dL (ref 0.50–1.10)
Creatinine, Ser: 0.67 mg/dL (ref 0.50–1.10)
GFR calc Af Amer: 87 mL/min — ABNORMAL LOW (ref >90)
GFR, EST NON AFRICAN AMERICAN: 75 mL/min — AB (ref >90)
GFR, EST NON AFRICAN AMERICAN: 78 mL/min — AB (ref >90)
GFR, EST NON AFRICAN AMERICAN: 78 mL/min — AB (ref >90)
GLUCOSE: 101 mg/dL — AB (ref 70–99)
Glucose, Bld: 127 mg/dL — ABNORMAL HIGH (ref 70–99)
Glucose, Bld: 100 mg/dL — ABNORMAL HIGH (ref 70–99)
POTASSIUM: 4 meq/L (ref 3.7–5.3)
Potassium: 4 mEq/L (ref 3.7–5.3)
Potassium: 4.1 mEq/L (ref 3.7–5.3)
SODIUM: 153 meq/L — AB (ref 137–147)
Sodium: 146 mEq/L (ref 137–147)
Sodium: 145 mEq/L (ref 137–147)

## 2014-06-16 LAB — CBC
HCT: 32.2 % — ABNORMAL LOW (ref 36.0–46.0)
Hemoglobin: 10.3 g/dL — ABNORMAL LOW (ref 12.0–15.0)
MCH: 32.2 pg (ref 26.0–34.0)
MCHC: 32 g/dL (ref 30.0–36.0)
MCV: 100.6 fL — ABNORMAL HIGH (ref 78.0–100.0)
Platelets: 443 10*3/uL — ABNORMAL HIGH (ref 150–400)
RBC: 3.2 MIL/uL — ABNORMAL LOW (ref 3.87–5.11)
RDW: 16.4 % — ABNORMAL HIGH (ref 11.5–15.5)
WBC: 19.2 10*3/uL — ABNORMAL HIGH (ref 4.0–10.5)

## 2014-06-16 MED ORDER — DIVALPROEX SODIUM 250 MG PO DR TAB
250.0000 mg | DELAYED_RELEASE_TABLET | Freq: Four times a day (QID) | ORAL | Status: DC
  Administered 2014-06-16: 250 mg via ORAL
  Filled 2014-06-16 (×4): qty 1

## 2014-06-16 MED ORDER — LEVOFLOXACIN 750 MG PO TABS
750.0000 mg | ORAL_TABLET | Freq: Every day | ORAL | Status: DC
  Filled 2014-06-16: qty 1

## 2014-06-16 MED ORDER — DIVALPROEX SODIUM 125 MG PO CPSP
250.0000 mg | ORAL_CAPSULE | Freq: Four times a day (QID) | ORAL | Status: DC
  Administered 2014-06-16 – 2014-06-18 (×7): 250 mg via ORAL
  Filled 2014-06-16 (×11): qty 2

## 2014-06-16 MED ORDER — LEVOFLOXACIN 750 MG PO TABS
750.0000 mg | ORAL_TABLET | ORAL | Status: DC
  Administered 2014-06-16 – 2014-06-18 (×2): 750 mg via ORAL
  Filled 2014-06-16 (×2): qty 1

## 2014-06-16 MED ORDER — METOPROLOL TARTRATE 25 MG PO TABS
25.0000 mg | ORAL_TABLET | Freq: Two times a day (BID) | ORAL | Status: DC
  Administered 2014-06-16 – 2014-06-18 (×5): 25 mg via ORAL
  Filled 2014-06-16 (×7): qty 1

## 2014-06-16 MED ORDER — METOPROLOL TARTRATE 1 MG/ML IV SOLN: 5.0000 mg | INTRAVENOUS | Status: DC | PRN

## 2014-06-16 NOTE — Progress Notes (Signed)
Family Medicine Teaching Service Daily Progress Note Intern Pager: [phone]  Patient name: Jenny Patterson Saxe Medical record number: [id number] Date of birth: Aug 07, 1927 Age: 80 y.o. Gender: female  Primary Care Provider: No PCP Per Patient Consultants: none Code Status: Full  Pt Overview and Major Events to Date:  6/25: Admitted, febrile, WBC 28.2, Na 159 (1/2 NS), cultures pending, no identified source CXR neg 6/26: Zosyn (6/26 >>), Na 157 (1/4 NS), start D5-W x 24 hrs, BMET q 4, SLP-Dys1 diet, inc Metoprolol  Assessment and Plan: Jenny Patterson is a 80 y.o. female presenting (directly from [facility]) with SIRS, hypernatremia and AMS. PMH is significant for Bipolar disorder, dementia and hypertension  # SIRS, with unknown infectious source, concern for aspiration PNA:  Improved, unclear infectious source. On admission, febrile to 100.63F, tachycardic, tachypnea, no O2 requirement (>98% on RA), acute encephalopathy.  Work up (including CXR, CT abd, xrays of feet, blood and urine cultres, c. diff) negative. - Afebrile over 48 hours - cont Telemetry - blood and urine cultures ngtd - continue to trend CBC - will check 2V CXR to evaluate for PNA - continue vanc/zosyn for now, consider stopping if CXR clear  # Hypernatremia:  Admission Na 159, likely decreased PO intake, dehydration, has recent Hx of some loose stools. This is likely contributing to, if not main cause, of AMS. Urine osms appropriate at 744. - Na 146 today - decrease D5 to 50cc/hr - recheck BMP 1700 --> if Na continuing to trend down will stop IVF - Now taking PO  # Acute encephalopathy: Improved.  Most likely caused by hypernatremia vs sedation from psych meds vs possible infection (although only meets SIRS criteria because no source of infection). CT head showed remote infarcts and stable left posterior fossa mass (likely meningioma).  - continue to monitor  # Atrial Fibrillation, unknown onset / duration: Initially RVR,  not on EKG, but confirmed on telemetry and exam. - HR stable on metoprolol 10mg  Q6hr --> will change to PO today - ASA 81mg  daily  # Bipolar disorder/psych: on Depakote, Effexor, Seroquel, Trazadone, ativan and Haldol  - depakote changed to PO - Continue haldol and ativan as PRN for agitation only  - continue Seroquel 100mg  PO qHS - Hold Effexor and Trazadone to reduce sedation effects  # Hypertension:  - well controlled - continue to hold HCTZ given concerns about dehydration on admission - metoprolol changed to 25mg  BID --> monitor BP and heart rate - continue Amlodipine 10mg  daily - hydralazine 5mg  IV PRN for SBP >180   # Hypokalemia: Resolved.  FEN/GI: Dysphagia 1 diet / nectar thick, crush meds / D5-Water @ 50 cc/hr  Prophylaxis: Heparin subq  Disposition: pending work up  Subjective:  Resting in bed; rouses easily to voice.  She is alert but not oriented.  Answers yes or no questions; follows some commands.  Denies pain.  Does report shortness of breath.  States she is hungry.   Objective: Temp:  [98.3 F (36.8 C)-99.5 F (37.5 C)] 98.3 F (36.8 C) (06/28 14780614) Pulse Rate:  [80-112] 80 (06/28 0614) Resp:  [16-18] 16 (06/28 0614) BP: (126-153)/(54-80) 133/80 mmHg (06/28 0614) SpO2:  [94 %-100 %] 97 % (06/28 0614) Weight:  [155 lb 1.5 oz (70.35 kg)] 155 lb 1.5 oz (70.35 kg) (06/27 2204) Physical Exam: Gen: resting in bed, calm and comfortable, NAD HEENT: NCAT, MMM Resp: CTAB, limited cooperation with exam Heart: RRR, no murmurs Abd: Soft, NT/ND. BS+ and normal.  Ext: bilateral feet in  floater boots Neuro: awake, alert, follows some commands, not oriented  Laboratory:  Recent Labs Lab 06/14/14 0320 06/15/14 0429 06/16/14 0100  WBC 26.2* 19.2* 19.2*  HGB 10.7* 9.9* 10.3*  HCT 34.5* 32.5* 32.2*  PLT 427* 373 443*    Recent Labs Lab 06/13/14 1220  06/15/14 0810 06/15/14 1537 06/16/14 0100  NA 157*  < > 158* 155* 153*  K 3.6*  < > 3.3* 4.0 4.0  CL  112  < > 116* 115* 113*  CO2 32  < > 31 30 29   BUN 50*  < > 25* 25* 23  CREATININE 0.97  < > 0.71 0.74 0.74  CALCIUM 9.2  < > 8.0* 8.2* 8.1*  PROT 6.3  --   --   --   --   BILITOT 0.3  --   --   --   --   ALKPHOS 80  --   --   --   --   ALT 19  --   --   --   --   AST 24  --   --   --   --   GLUCOSE 167*  < > 99 97 101*  < > = values in this interval not displayed. Lactic Acid - 1.91 TSH - 2.680  Imaging/Diagnostic Tests:  (Previous CT Head 05/30/14 - recent ED visit) IMPRESSION:  Chronic atrophy and small vessel ischemic changes. Suggestion of  focal acute infarct in the left posterior temporal region. No  significant mass effect. No acute intracranial hemorrhage. Stable  appearance of calcified extra-axial lesion in the left posterior  cranial fossa, possibly meningioma.  6/25 CXR Portable 1v IMPRESSION:  No active disease. Chest is stable from prior exam.  6/25 CT Abd IMPRESSION:  Mild apparent right hydronephrosis and extra renal pelvis which may  be better characterized with ultrasound as clinically indicated.  Foley catheter decompresses urinary bladder.  Large amount of stool projecting at rectal vault without bowel  obstruction.  06/14/2014 BL foot XR IMPRESSION:  Diffuse osteopenia. No acute fracture or subluxation. No evidence of  osteomyelitis.   Charm RingsErin J Honig, MD 06/16/2014, 8:23 AM PGY-3, [place] [facility] FPTS Intern pager: [phone], text pages welcome

## 2014-06-16 NOTE — Progress Notes (Addendum)
FMTS ATTENDING  NOTE Kehinde Eniola,MD I  have seen and examined this patient, reviewed their chart. I have discussed this patient with the resident. I agree with the resident's findings, assessment and care plan.Patient seem to be progressing in the right direction,she communicated better with me today. Her serum sodium seem to be improving as well, WBC trending in the right direction on Zosyn and Vanc. Urine and blood culture with no growth and has been afebrile for 24 hrs or more. Source of infection unknown, may consider stopping A/B IV and switch to oral tomorrow if continue to improve with no source of infection. HR fluctuate up and down but currently normal, hearth rhythm irregular, plan to obtain repeat EKG for confirmation of Afib. No alarm on telemetry. Continue to monitor for improvement.

## 2014-06-16 NOTE — Progress Notes (Signed)
EKG shows a fib with RVR and a rate of 108.  Last documented HR was 97.  She is on metoprolol 25mg  PO BID with IV metoprolol prn for HR >110.  She is on aspirin 81mg .  Will not make any changes at this time.  Elaijah Munoz 06/16/2014, 7:40 PM

## 2014-06-17 DIAGNOSIS — F0390 Unspecified dementia without behavioral disturbance: Secondary | ICD-10-CM

## 2014-06-17 LAB — BASIC METABOLIC PANEL
BUN: 16 mg/dL (ref 6–23)
CO2: 24 meq/L (ref 19–32)
Calcium: 8.1 mg/dL — ABNORMAL LOW (ref 8.4–10.5)
Chloride: 107 mEq/L (ref 96–112)
Creatinine, Ser: 0.66 mg/dL (ref 0.50–1.10)
GFR calc Af Amer: >90 mL/min (ref >90)
GFR, EST NON AFRICAN AMERICAN: 78 mL/min — AB (ref >90)
GLUCOSE: 115 mg/dL — AB (ref 70–99)
POTASSIUM: 4.8 meq/L (ref 3.7–5.3)
SODIUM: 142 meq/L (ref 137–147)

## 2014-06-17 LAB — CBC
HCT: 32.9 % — ABNORMAL LOW (ref 36.0–46.0)
HEMOGLOBIN: 10.4 g/dL — AB (ref 12.0–15.0)
MCH: 30.9 pg (ref 26.0–34.0)
MCHC: 31.6 g/dL (ref 30.0–36.0)
MCV: 97.6 fL (ref 78.0–100.0)
Platelets: 405 10*3/uL — ABNORMAL HIGH (ref 150–400)
RBC: 3.37 MIL/uL — AB (ref 3.87–5.11)
RDW: 15.4 % (ref 11.5–15.5)
WBC: 17.2 10*3/uL — AB (ref 4.0–10.5)

## 2014-06-17 MED ORDER — HYDROCHLOROTHIAZIDE 25 MG PO TABS: 25.0000 mg | ORAL_TABLET | Freq: Every day | ORAL | Status: AC

## 2014-06-17 MED ORDER — GUAIFENESIN 100 MG/5ML PO SYRP: 200.0000 mg | ORAL_SOLUTION | Freq: Four times a day (QID) | ORAL | Status: AC | PRN

## 2014-06-17 MED ORDER — QUETIAPINE FUMARATE 100 MG PO TABS: 100.0000 mg | ORAL_TABLET | Freq: Every day | ORAL | Status: AC

## 2014-06-17 MED ORDER — LEVOFLOXACIN 750 MG PO TABS: 750.0000 mg | ORAL_TABLET | ORAL | Status: DC

## 2014-06-17 MED ORDER — ALUM & MAG HYDROXIDE-SIMETH 200-200-20 MG/5ML PO SUSP: 30.0000 mL | Freq: Four times a day (QID) | ORAL | Status: AC | PRN

## 2014-06-17 MED ORDER — MAGNESIUM HYDROXIDE 400 MG/5ML PO SUSP: 30.0000 mL | Freq: Every evening | ORAL | Status: AC | PRN

## 2014-06-17 MED ORDER — LORAZEPAM 0.5 MG PO TABS: 0.2500 mg | ORAL_TABLET | Freq: Two times a day (BID) | ORAL | Status: AC | PRN

## 2014-06-17 MED ORDER — LORAZEPAM 0.5 MG PO TABS: 0.2500 mg | ORAL_TABLET | Freq: Two times a day (BID) | ORAL | Status: DC | PRN

## 2014-06-17 MED ORDER — DIVALPROEX SODIUM 250 MG PO DR TAB: 250.0000 mg | DELAYED_RELEASE_TABLET | Freq: Four times a day (QID) | ORAL | Status: AC

## 2014-06-17 MED ORDER — VENLAFAXINE HCL 37.5 MG PO TABS: 37.5000 mg | ORAL_TABLET | Freq: Three times a day (TID) | ORAL | Status: AC

## 2014-06-17 MED ORDER — TRAZODONE HCL 50 MG PO TABS: 50.0000 mg | ORAL_TABLET | Freq: Every day | ORAL | Status: AC

## 2014-06-17 MED ORDER — AMLODIPINE BESYLATE 10 MG PO TABS: 10.0000 mg | ORAL_TABLET | Freq: Every day | ORAL | Status: AC

## 2014-06-17 MED ORDER — HALOPERIDOL 1 MG PO TABS: 1.0000 mg | ORAL_TABLET | Freq: Three times a day (TID) | ORAL | Status: AC

## 2014-06-17 MED ORDER — METOPROLOL TARTRATE 25 MG PO TABS: 25.0000 mg | ORAL_TABLET | Freq: Every day | ORAL | Status: AC

## 2014-06-17 MED ORDER — BACITRACIN-NEOMYCIN-POLYMYXIN 400-5-5000 EX OINT: 1.0000 "application " | TOPICAL_OINTMENT | CUTANEOUS | Status: DC | PRN

## 2014-06-17 MED ORDER — LOPERAMIDE HCL 2 MG PO CAPS: 2.0000 mg | ORAL_CAPSULE | ORAL | Status: AC | PRN

## 2014-06-17 NOTE — Clinical Social Work Psychosocial (Signed)
Clinical Social Work Department BRIEF PSYCHOSOCIAL ASSESSMENT 06/17/2014  Patient:  Jenny Patterson,Jenny Patterson     Account Number:  [id number]     Admit date:  06/13/2014  Clinical Social Worker:  Delmer IslamRAWFORD,VANESSA, LCSW  Date/Time:  06/17/2014 01:51 AM  Referred by:  Physician  Date Referred:  06/17/2014 Referred for  ALF Placement   Other Referral:   Interview type:  Patient Other interview type:    PSYCHOSOCIAL DATA Living Status:  FACILITY Ad[facility] Level of care:  Assisted Living Primary support name:  Jenny Patterson Primary support relationship to patient:  NONE Degree of support available:   Jenny Patterson is patient's guardian. Per staff at faclity, this is patient's primary contact. Patient has a sister, Jenny Patterson [phone]). CSW attempted to reach sister and number disconnected.    CURRENT CONCERNS Current Concerns  Post-Acute Placement   Other Concerns:    SOCIAL WORK ASSESSMENT / PLAN [facility] and the plan is for patient to return. CSW talked with patient, however patient at is somewhat confused as she did not recognize the name of her facility when CSW informed her of returning today. Patient also talked with CSW about where she would be receiving her mail.   Assessment/plan status:  Psychosocial Support/Ongoing Assessment of Needs Other assessment/ plan:   Information/referral to community resources:   None needed or requested at this time.    PATIENT'S/FAMILY'S RESPONSE TO PLAN OF CARE: CSW talked with patient regarding discharge back to facility. Patient has some confusion and did not recognize name of facility when CSW talked with her about discharge plans.

## 2014-06-17 NOTE — Progress Notes (Signed)
Family Medicine Teaching Service Daily Progress Note Intern Pager: [phone]  Patient name: Jenny Patterson Medical record number: [id number] Date of birth: 07-03-1927 Age: 80 y.o. Gender: female  Primary Care Provider: No PCP Per Patient Consultants: none Code Status: Full  Pt Overview and Major Events to Date:  6/25: Admitted, febrile, WBC 28.2, Na 159 (1/2 NS), cultures pending, no identified source CXR neg 6/26: Vamc / Zosyn (6/26 >>27), Na 157 (1/4 NS), start D5-W x 24 hrs, BMET q 4, SLP-Dys1 diet, inc Metoprolol 6/28: afebrile, Levaquin PO (6/28>>), WBC 19.2 6/29: afebrile, WBC 17.2  Assessment and Plan: Jenny Patterson is a 80 y.o. female presenting (directly from SNF-[facility]) with SIRS, hypernatremia and AMS. PMH is significant for Bipolar disorder, dementia and hypertension  # SIRS, with unknown infectious source - Improved - Unclear infectious source. On admission, febrile to 100.83F, tachycardic, tachypnea, no O2 requirement (>98% on RA), acute encephalopathy.  Work up (including CXR, CT abd, xrays of feet, blood and urine cultres, c. diff) negative. Initial concern for potential aspiration PNA, however repeat CXR negative as well. - Afebrile over 72 hours - cont Telemetry - Blood cultures x 2 (6/25) >> NGTD, Urine Culture (6/25) - No Growth (final) - improved WBC 19.2 >> 17.2 - Continue Levaquin 750mg  PO q 48 hrs, discontinued Vanc / Zosyn IV (6/26 >> 6/27)  # Hypernatremia - Improved - Admission Na 159, likely decreased PO intake, dehydration, has recent Hx of some loose stools. This is likely contributing to, if not main cause, of AMS. Urine osms appropriate at 744. - Improved Na 145 >> 142 - SLIV, discontinued D5-W IVF - daily BMET - Improved PO intake  # Acute encephalopathy - Improved, suspected to be near baseline - Most likely caused by hypernatremia vs sedation from psych meds vs possible infection (although only meets SIRS criteria because no source of infection).  CT head showed remote infarcts and stable left posterior fossa mass (likely meningioma).   # Atrial Fibrillation, unknown onset / duration: Initially RVR, not on EKG, but confirmed on telemetry and exam. - Continue Metoprolol 25mg  PO BID - ASA 81mg  daily  # Bipolar disorder/psych: on Depakote, Effexor, Seroquel, Trazadone, ativan and Haldol  - depakote changed to PO - Continue haldol and ativan as PRN for agitation only  - continue Seroquel 100mg  PO qHS - Hold Effexor and Trazadone to reduce sedation effects  # Hypertension  - well controlled - continue to hold HCTZ given concerns about dehydration on admission - Metoprolol 25mg  PO BID, and Metoprolol IV PRN - continue Amlodipine 10mg  daily - hydralazine 5mg  IV PRN for SBP >180   # Hypokalemia: Resolved.  FEN/GI: Dysphagia 1 diet / nectar thick, crush meds / SLIV  Prophylaxis: Heparin subq  Disposition: pending work up for initial fever / elevated WBC without etiology, remains AFib rate controlled, improved acute encephalopathy, tolerating PO and on PO antibiotics, anticipate discharge back to SNF today - c/s CSW - re: initial plan to discharge back to prior ALF, however per accepting physician they feel that pt will require SNF care given decreased independence - CSW to contact pt's guardian and will arrange for locating SNF bed offers, anticipate DC in 1-2 days  Subjective:  Sitting up in bed with nurse at bedside assisting with feeding. Patient with repetitive verbal responses, otherwise unable to provide any history given dementia. Denies any pain.  Objective: Temp:  [97.4 F (36.3 C)-98.8 F (37.1 C)] 97.4 F (36.3 C) (06/29 0933) Pulse Rate:  [89-104]  90 (06/29 0933) Resp:  [16-19] 16 (06/29 0933) BP: (131-169)/(62-92) 162/80 mmHg (06/29 0933) SpO2:  [96 %-100 %] 96 % (06/29 0933) Weight:  [152 lb 12.5 oz (69.3 kg)] 152 lb 12.5 oz (69.3 kg) (06/28 2207) Physical Exam: Gen: resting in bed, calm and comfortable,  NAD HEENT: NCAT, MMM Resp: CTAB, limited cooperation with exam Heart: RRR, no murmurs Abd: Soft, NT/ND. BS+ and normal.  Ext: bilateral feet in floater boots Neuro: awake, alert, follows some commands, not oriented  Laboratory:  Recent Labs Lab 06/15/14 0429 06/16/14 0100 06/17/14 0415  WBC 19.2* 19.2* 17.2*  HGB 9.9* 10.3* 10.4*  HCT 32.5* 32.2* 32.9*  PLT 373 443* 405*    Recent Labs Lab 06/13/14 1220  06/16/14 0743 06/16/14 1718 06/17/14 0415  NA 157*  < > 146 145 142  K 3.6*  < > 4.1 4.0 4.8  CL 112  < > 110 107 107  CO2 32  < > 25 27 24   BUN 50*  < > 19 18 16   CREATININE 0.97  < > 0.66 0.67 0.66  CALCIUM 9.2  < > 7.8* 8.0* 8.1*  PROT 6.3  --   --   --   --   BILITOT 0.3  --   --   --   --   ALKPHOS 80  --   --   --   --   ALT 19  --   --   --   --   AST 24  --   --   --   --   GLUCOSE 167*  < > 127* 100* 115*  < > = values in this interval not displayed. Lactic Acid - 1.91 TSH - 2.680  Urine Osm - 744 Urine Sodium - 45  Imaging/Diagnostic Tests:  (Previous CT Head 05/30/14 - recent ED visit) IMPRESSION:  Chronic atrophy and small vessel ischemic changes. Suggestion of  focal acute infarct in the left posterior temporal region. No  significant mass effect. No acute intracranial hemorrhage. Stable  appearance of calcified extra-axial lesion in the left posterior  cranial fossa, possibly meningioma.  6/25 CXR Portable 1v IMPRESSION:  No active disease. Chest is stable from prior exam.  6/25 CT Abd IMPRESSION:  Mild apparent right hydronephrosis and extra renal pelvis which may  be better characterized with ultrasound as clinically indicated.  Foley catheter decompresses urinary bladder.  Large amount of stool projecting at rectal vault without bowel  obstruction.  06/14/2014 BL foot XR IMPRESSION:  Diffuse osteopenia. No acute fracture or subluxation. No evidence of  Osteomyelitis.  6/28 CXR 2v IMPRESSION:  No acute cardiopulmonary  disease.  Saralyn PilarAlexander Karamalegos, DO 06/17/2014, 10:25 AM PGY-1, [facility] FPTS Intern pager: [phone], text pages welcome

## 2014-06-17 NOTE — Progress Notes (Signed)
CALL PAGER [phone] for any questions or notifications regarding this patient   FMTS Attending Daily Note: Sara Neal MD  Attending pager:[phone]  office [phone]  I  have seen and examined this patient, reviewed their chart. I have discussed this patient with the resident. I agree with the resident's findings, assessment and care plan. 

## 2014-06-17 NOTE — Discharge Summary (Signed)
Family Medicine Teaching [facility] Discharge Summary  Patient name: Jenny Patterson Medical record number: [id number] Date of birth: 02/09/1927 Age: 80 y.o. Gender: female Date of Admission: 06/13/2014  Date of Discharge: 06/17/14 Admitting Physician: Janit Pagan, MD  Primary Care Naamah Boggess: No PCP Per Patient Consultants: None  Indication for Hospitalization: Acute encephalopathy, Hypernatremia  Discharge Diagnoses/Problem List:  Acute Encephalopathy, likely secondary to metabolic / infectious, in setting of chronic dementia - Resolved SIRS criteria, with unknown infectious source Hypernatremia - Resolved Atrial Fibrillation, unknown onset / duration Bipolar Disorder / Psych HTN Hypokalemia - Resolved  Disposition: SNF ([facility])  Discharge Condition: Stable  Discharge Exam: Gen: NAD  HEENT: NCAT, MMM  Resp: CTAB, limited cooperation with exam  Heart: RRR, no murmurs  Abd: Soft, NT/ND. BS+ and normal.  Ext: bilateral feet in floater boots  Neuro: awake, alert, follows some commands, not oriented   Brief Hospital Course:  Jenny Patterson is a 80 y.o. female presenting (directly from [facility] [place] with acute encephalopathy, found to have hypernatremia (159) and meet SIRS criteria (+febrile 100.79F, tachycardic, tachypnea). PMH is significant for Chronic Dementia, HTN, Bipolar disorder. Significant recent history of ED visit on 05/30/14 following fall at SNF and questionable head injury, CT Head with possible ischemic changes but no acute findings, clinically well appearing at that time without focal deficits.  Initial work-up with significant labs Na 159, elevated BUN 50, WBC 28.2 (ANC 22.5), Lactic Acid 1.9 (nml), given SIRS work-up for potential source, with CXR (negative, no infiltrate), CT Abd (negative for infection), unlikely infection from bilateral heel/foot pressure skin changes (no open ulcer) with X-rays (negative for underlying infection / osteo), C.Diff  (negative), UA (negative), collected blood and urine cultures. GCS 11, initially non-response except painful stimuli, since improved to appear somnolent, obtunded but more alert, remained disoriented but without significant distress. Admitted for further work-up, correction of Na with IVF, started IV antibiotics Zosyn, Vancomycin, resumed IV medications including Metoprolol for Atrial Fibrillation, BP hypertensive on admission.  During hospitalization, patient continued to demonstrate gradual improvement within 24-48 hrs following correction of Na from 159 down to low 150s, and eventually down to 142 (by Day 4), initially used D5-1/2 to 1/4 NS, then transitioned to D5-W to correct fluid deficit over 48 hours. Repeated Head CT (stable since 6/11, without acute findings, noted prior remote ischemic areas, chronic changes, and stable meningioma), overall significantly improved mental status with alert / awake / oriented (person only), cooperative with exam, some appropriate verbal responses. Remained afebrile since admission, WBC gradually trending down, initial concern for aspiration PNA, however repeat CXR 2v negative, and passed SLP swallow (Dys-1 diet, nectar thick). Persistent Atrial Fibrillation, since rate controlled, added daily ASA 81mg , not good candidate for anticoagulation. On discharge, patient appeared to be near mental status baseline, dramatically improved, tolerating PO with assistance, vitals stable, remains afebrile, to complete course of Levaquin PO.  Prior to discharge, discussed case with previous ALF-[facility], who believed patient would benefit from SNF placement for short term rehab. Discussed case with patient's legal guardian with DSS Sherlynn Stalls), who selected [facility] upon discharge.  Issues for Follow Up:  1. Elevated WBC / SIRS - No identified source of infection. To complete Levaquin 750mg  PO qod, skip 7/1, and last dose 06/20/14. Low threshold to repeat Chest Xray  if concern, initially concern for aspiration PNA, however negative X-rays and normal lung exam.  2. Hypernatremia - Admission 159 down to 142 on discharge. Consider repeating BMET within 3-5 days, otherwise  as long as taking good PO should remain stable.  3. Atrial Fibrillation / RVR - Continue rate control with Metoprolol, and daily ASA 81mg , not candidate for anticoagulation.  4. Contact Levette Mayford KnifeWilliams (legal guardian, DSS) - [phone] regarding any concerns or questions.  Significant Procedures: none  Significant Labs and Imaging:   Recent Labs Lab 06/16/14 0100 06/17/14 0415 06/18/14 0552  WBC 19.2* 17.2* 12.8*  HGB 10.3* 10.4* 10.1*  HCT 32.2* 32.9* 31.8*  PLT 443* 405* 371    Recent Labs Lab 06/13/14 1220  06/15/14 1537 06/16/14 0100 06/16/14 0743 06/16/14 1718 06/17/14 0415  NA 157*  < > 155* 153* 146 145 142  K 3.6*  < > 4.0 4.0 4.1 4.0 4.8  CL 112  < > 115* 113* 110 107 107  CO2 32  < > 30 29 25 27 24   GLUCOSE 167*  < > 97 101* 127* 100* 115*  BUN 50*  < > 25* 23 19 18 16   CREATININE 0.97  < > 0.74 0.74 0.66 0.67 0.66  CALCIUM 9.2  < > 8.2* 8.1* 7.8* 8.0* 8.1*  ALKPHOS 80  --   --   --   --   --   --   AST 24  --   --   --   --   --   --   ALT 19  --   --   --   --   --   --   ALBUMIN 1.8*  --   --   --   --   --   --   < > = values in this interval not displayed.  Lactic Acid - 1.91  TSH - 2.680  Urine Osm - 744  Urine Sodium - 45  Blood Culture x 2 (6/25) >>> NGTD  UA - negative for infection Urine Culture (6/25) >> No growth, Final.  Imaging/Diagnostic Tests:  (Previous CT Head 05/30/14 - recent ED visit)  IMPRESSION:  Chronic atrophy and small vessel ischemic changes. Suggestion of  focal acute infarct in the left posterior temporal region. No  significant mass effect. No acute intracranial hemorrhage. Stable  appearance of calcified extra-axial lesion in the left posterior  cranial fossa, possibly meningioma.  6/25 CXR Portable 1v   IMPRESSION:  No active disease. Chest is stable from prior exam.  6/25 CT Abd  IMPRESSION:  Mild apparent right hydronephrosis and extra renal pelvis which may  be better characterized with ultrasound as clinically indicated.  Foley catheter decompresses urinary bladder.  Large amount of stool projecting at rectal vault without bowel  Obstruction.  6/26 Head CT IMPRESSION:  No acute intracranial process.  Moderate white matter changes suggest chronic small vessel ischemic  disease in addition to remote bilateral basal ganglia and thalamus  lacunar infarcts. Remote small left anterior cerebral artery  territory infarct.  Stable 12 x 18 mm left posterior fossa calcified mass likely  reflects meningioma.  06/14/2014 BL foot XR  IMPRESSION:  Diffuse osteopenia. No acute fracture or subluxation. No evidence of  Osteomyelitis.  6/28 CXR 2v  IMPRESSION:  No acute cardiopulmonary disease.  Results/Tests Pending at Time of Discharge:   Blood Culture x 2 (6/25) >>> NGTD  Discharge Medications:    Medication List    STOP taking these medications       neomycin-bacitracin-polymyxin ointment  Commonly known as:  NEOSPORIN      TAKE these medications       alum & mag hydroxide-simeth 200-200-20  MG/5ML suspension  Commonly known as:  MAALOX/MYLANTA  Take 30 mLs by mouth 4 (four) times daily as needed for indigestion or heartburn.     amLODipine 10 MG tablet  Commonly known as:  NORVASC  Take 1 tablet (10 mg total) by mouth daily.     divalproex 250 MG DR tablet  Commonly known as:  DEPAKOTE  Take 1 tablet (250 mg total) by mouth 4 (four) times daily.     guaifenesin 100 MG/5ML syrup  Commonly known as:  ROBITUSSIN  Take 10 mLs (200 mg total) by mouth every 6 (six) hours as needed for cough.     haloperidol 1 MG tablet  Commonly known as:  HALDOL  Take 1 tablet (1 mg total) by mouth every 8 (eight) hours.     hydrochlorothiazide 25 MG tablet  Commonly known as:   HYDRODIURIL  Take 1 tablet (25 mg total) by mouth daily.     levofloxacin 750 MG tablet  Commonly known as:  LEVAQUIN  Take 1 tablet (750 mg total) by mouth every other day. Start on 06/18/14, skip 06/19/14, last dose 06/20/14  Start taking on:  06/20/2014     loperamide 2 MG capsule  Commonly known as:  IMODIUM  Take 1 capsule (2 mg total) by mouth as needed for diarrhea or loose stools.     LORazepam 0.5 MG tablet  Commonly known as:  ATIVAN  Take 0.5 tablets (0.25 mg total) by mouth 2 (two) times daily as needed (agitation).     magnesium hydroxide 400 MG/5ML suspension  Commonly known as:  MILK OF MAGNESIA  Take 30 mLs by mouth at bedtime as needed for mild constipation.     metoprolol tartrate 25 MG tablet  Commonly known as:  LOPRESSOR  Take 1 tablet (25 mg total) by mouth daily.     QUEtiapine 100 MG tablet  Commonly known as:  SEROQUEL  Take 1 tablet (100 mg total) by mouth at bedtime.     traZODone 50 MG tablet  Commonly known as:  DESYREL  Take 1 tablet (50 mg total) by mouth at bedtime.     venlafaxine 37.5 MG tablet  Commonly known as:  EFFEXOR  Take 1 tablet (37.5 mg total) by mouth 3 (three) times daily with meals.        Discharge Instructions: Please refer to Patient Instructions section of EMR for full details.  Patient was counseled important signs and symptoms that should prompt return to medical care, changes in medications, dietary instructions, activity restrictions, and follow up appointments.   Follow-Up Appointments: Follow-up Information   Follow up with [facility].   Contact information:   [facility] [address] [place] [address] [phone]       Saralyn PilarAlexander Karamalegos, DO 06/18/2014, 2:55 PM PGY-1, [facility]

## 2014-06-17 NOTE — Progress Notes (Signed)
Speech Language Pathology Treatment: Dysphagia  Patient Details Name: Jenny Patterson MRN: [id number] DOB: [birth year] Today's Date: 06/17/2014 Time: [id number] SLP Time Calculation (min): 32 min  Assessment / Plan / Recommendation Clinical Impression  Pt very confused and upset, stating, "My sister just called and was screaming.  I have to go find out what is wrong.  Will you please take me?  I have to go to the hospital tomorrow.  I've got to get help."  Pt calmed down with reorientation and reassurance.  For d/c to SNF today.  Pt. Took a sip of nectar thick liquid and a small bite of ensure pudding without overt s/s of aspiration.  Pt declined further po's, reporting she just ate and can't eat anymore.  She has remained afebrile and CXR is clear.  Continue current diet and f/u with SLP at SNF.   HPI HPI: Jenny Patterson is a 80 y.o. female presenting with SIRS, hypernatremia and AMS. PMH is significant for Bipolar disorder, dementia and hypertension, admitted for altered mental status. Patient febrile, tachycardic with elevated white count and tachypnea. No source of infection found on initial evaluation. Chest x-ray, CT abdomen and UA with no signs of infection. Also presenting with hypernatremia (possibly d/t decreased oral intake of fluids) and acute encephalopathy.    Pertinent Vitals Afebrile   SLP Plan  Discharge SLP treatment due to (comment) (d/c to SNF)    Recommendations Diet recommendations: Nectar-thick liquid;Dysphagia 1 (puree) Liquids provided via: Cup;No straw Medication Administration: Crushed with puree Supervision: Staff to assist with self feeding;Full supervision/cueing for compensatory strategies Compensations: Slow rate;Small sips/bites Postural Changes and/or Swallow Maneuvers: Seated upright 90 degrees;Upright 30-60 min after meal              Oral Care Recommendations: Oral care BID Follow up Recommendations: Skilled Nursing facility Plan: Discharge SLP treatment due to  (comment) (d/c to SNF)    GO     Maryjo RochesterWillis, Lori T 06/17/2014, 4:13 PM

## 2014-06-17 NOTE — Discharge Instructions (Signed)
Hospitalized due to altered mental status, fever, elevated Sodium level, and concern for infection. Our testing was unable to locate a source of infection, she was covered with antibiotics and will complete a Levaquin course for total 7 days antibiotics Sodium was corrected with IV fluids. We expect that she was dehydrated with decreased oral intake [facility] Encourage improved PO intake  If significant fever, evidence of infection, chest pain / shortness of breath, or uncontrolled heart rate, call primary covering physician for evaluation, otherwise return to Emergency Department for evaluation.

## 2014-06-17 NOTE — Clinical Social Work Note (Signed)
Patient medically stable for discharge today. CSW [facility] staff [facility] regarding [facility]'s readiness for discharge and transmitted paperwork (FL-2 and d/c summary) to facility for review. CSW advised by Tammy that clinicals reviewed by house doctor and it is being recommended that patient discharge to a skilled nursing facility for short-term rehab due to her wounds and the indwelling catheter. CSW informed that they are unable to provide the care patient will need as their nurse only comes once a month. CSW also advised that patient was independent prior to coming to hospital.  CSW informed that patient has a guardian, Jenny Patterson, SW [place] [facility] [place] [facility]: [phone] (office) and [phone] (cell).   Contact made with [name] SW updated on discharge plans and request by [facility] for patient to go to a skilled facility for rehab due to catheter and wounds. During conversation, [name] also reported that patient was independent prior to admission to hospital. SW is ok with patient going to a skilled facility although she expressed concern about patient's ability to rebound and return to ALF. [name] requested to talk with MD as she has had no contact with patient's doctor since patient's admission. CSW will send out patient's information to facilities [place] and advise SW of facility responses.  Jenny Patterson, MSW, LCSW [phone]

## 2014-06-18 LAB — CBC
HEMATOCRIT: 31.8 % — AB (ref 36.0–46.0)
Hemoglobin: 10.1 g/dL — ABNORMAL LOW (ref 12.0–15.0)
MCH: 31.6 pg (ref 26.0–34.0)
MCHC: 31.8 g/dL (ref 30.0–36.0)
MCV: 99.4 fL (ref 78.0–100.0)
PLATELETS: 371 10*3/uL (ref 150–400)
RBC: 3.2 MIL/uL — ABNORMAL LOW (ref 3.87–5.11)
RDW: 15.5 % (ref 11.5–15.5)
WBC: 12.8 10*3/uL — AB (ref 4.0–10.5)

## 2014-06-18 MED ORDER — LEVOFLOXACIN 750 MG PO TABS: 750.0000 mg | ORAL_TABLET | ORAL | Status: AC

## 2014-06-18 NOTE — Progress Notes (Signed)
CALL PAGER [phone] for any questions or notifications regarding this patient   FMTS Attending Daily Note: Sara Neal MD  Attending pager:[phone]  office [phone]  I  have seen and examined this patient, reviewed their chart. I have discussed this patient with the resident. I agree with the resident's findings, assessment and care plan. 

## 2014-06-18 NOTE — Clinical Social Work Placement (Signed)
Clinical Social Work Department CLINICAL SOCIAL WORK PLACEMENT NOTE 06/18/2014  Patient:  Horatio PelGREEN,Krissy  Account Number:  [id number] Admit date:  06/13/2014  Clinical Social Worker:  Genelle BalVANESSA CRAWFORD, LCSW  Date/time:  06/18/2014 02:52 AM  Clinical Social Work is seeking post-discharge placement for this patient at the following level of care:   SKILLED NURSING   (*CSW will update this form in Epic as items are completed)   06/17/2014  Patient/family provided with [facility] Department of Clinical Social Work's list of facilities offering this level of care within the geographic area requested by the patient (or if unable, by the patient's family).  06/17/2014  Patient/family informed of their freedom to choose among providers that offer the needed level of care, that participate in Medicare, Medicaid or managed care program needed by the patient, have an available bed and are willing to accept the patient.    Patient/family informed of [facility]' ownership interest in [facility], as well as of the fact that they are under no obligation to receive care at this facility.  PASARR submitted to EDS on 06/18/2014 PASARR number received on 06/18/2014  FL2 transmitted to all facilities in geographic area requested by pt/family on  06/17/2014 FL2 transmitted to all facilities within larger geographic area on   Patient informed that his/her managed care company has contracts with or will negotiate with  certain facilities, including the following:    Patient/family informed of bed offers received:  06/18/2014 Patient [facility] Physician recommends and patient chooses bed at    Patient to be [facility] on  06/18/2014 Patient to be transferred to facility by ambulance Patient and family notified of transfer on: 06/17/14 Name of person (family) member notified: Patient's SW Sherlynn StallsLevette Williams with [facility]  [place] [facility] Social [facility].     The following physician request were entered in Epic:   Additional Comments:

## 2014-06-18 NOTE — Progress Notes (Signed)
[facility] discharged [facility], report called to [place], [place]. Patient AVS [facility].  Patient remains stable; no signs or symptoms of distress. Patient transported via EMS with belongings at her side and Bilateral Prevalon boots on. Patient's gaurdian, Mrs. Mayford KnifeWilliams, was left a voicemail notifying her that [facility].

## 2014-06-18 NOTE — Progress Notes (Signed)
Speech Language Pathology Treatment: Dysphagia  Patient Details Name: Jenny Patterson MRN: [id number] DOB: 11-22-1927 Today's Date: 06/18/2014 Time: [id number] SLP Time Calculation (min): 21 min  Assessment / Plan / Recommendation Clinical Impression  F/u for dysphagia as D/C to SNF still pending: Pt calm, maintained eyes closed throughout session; repetitive speech.  Assisted with lunch meal; required total assist with feeding; min cues to accept POs, seal lips around spoon/cup.  Provided with trials of thin liquids, which continued to elicit immediate and consistent cough, suggesting potential aspiration.  Consumed nectar-thick liquids with no s/s aspiration; able to use straw safely.  Pt ate 1/4 of lunch meal before declining further POs.  Recommend continuing dysphagia 1/nectars upon D/C with f/u at SNF for diet advancement.     HPI HPI: Jenny Patterson is a 80 y.o. female presenting with SIRS, hypernatremia and AMS. PMH is significant for Bipolar disorder, dementia and hypertension, admitted for altered mental status. Patient febrile, tachycardic with elevated white count and tachypnea. No source of infection found on initial evaluation. Chest x-ray, CT abdomen and UA with no signs of infection. Also presenting with hypernatremia (possibly d/t decreased oral intake of fluids) and acute encephalopathy.       SLP Plan  D[facility]    Recommendations Diet recommendations: Dysphagia 1 (puree);Nectar-thick liquid Liquids provided via: Cup;Straw Medication Administration: Crushed with puree Supervision: Staff to assist with self feeding;Full supervision/cueing for compensatory strategies Compensations: Slow rate;Small sips/bites Postural Changes and/or Swallow Maneuvers: Seated upright 90 degrees;Upright 30-60 min after meal              Oral Care Recommendations: Oral care BID Follow up Recommendations: Skilled Nursing facility    GO     Blenda MountsCouture, Amanda Laurice 06/18/2014, 3:20  PM

## 2014-06-18 NOTE — Progress Notes (Signed)
Family Medicine Teaching Service Daily Progress Note Intern Pager: [phone]  Patient name: Jenny Patterson Medical record number: [id number] Date of birth: [birth year] Age: 80 y.o. Gender: female  Primary Care Provider: No PCP Per Patient Consultants: none Code Status: Full  Pt Overview and Major Events to Date:  6/25: Admitted, febrile, WBC 28.2, Na 159 (1/2 NS), cultures pending, no identified source CXR neg 6/26: Vamc / Zosyn (6/26 >>27), Na 157 (1/4 NS), start D5-W x 24 hrs, BMET q 4, SLP-Dys1 diet, inc Metoprolol 6/28: afebrile, Levaquin PO (6/28>>), WBC 19.2 6/29: afebrile, WBC 17.2 6/30: afebrile, improved mental status, WBC 12.8, plan to discharge to SNF  Assessment and Plan: Jenny Patterson is a 80 y.o. female presenting (directly from SNF-[facility]) with SIRS, hypernatremia and AMS. PMH is significant for Bipolar disorder, dementia and hypertension  # SIRS, with unknown infectious source - Improved - Unclear infectious source. On admission, febrile to 100.10F, tachycardic, tachypnea, no O2 requirement (>98% on RA), acute encephalopathy.  Work up (including CXR, CT abd, xrays of feet, blood and urine cultres, c. diff) negative. Initial concern for potential aspiration PNA, however repeat CXR negative as well. - Remains afebrile, discontinued telemetry - Blood cultures x 2 (6/25) >> NGTD, Urine Culture (6/25) - No Growth (final) - improved WBC 19.2 >> 17.2 - Continue Levaquin 750mg  PO q 48 hrs next dose 6/30, skip 7/1, last dose 7/2 - discontinued Vanc / Zosyn IV (6/26 >> 6/27)  # Hypernatremia - Resolved - Admission Na 159, likely decreased PO intake, dehydration, has recent Hx of some loose stools. This is likely contributing to, if not main cause, of AMS. Urine osms appropriate at 744. - Improved Na 145 >> 142 - SLIV, discontinued D5-W IVF - daily BMET - Improved PO intake  # Acute encephalopathy - Improved, suspected to be near baseline - Most likely caused by hypernatremia  vs sedation from psych meds vs possible infection (although only meets SIRS criteria because no source of infection). CT head showed remote infarcts and stable left posterior fossa mass (likely meningioma). - Discussed patient with legal guardian Quarry managerLevette Williams (DSS) today for updates and disposition plan  # Atrial Fibrillation, unknown onset / duration: Initially RVR, not on EKG, but confirmed on telemetry and exam. - Continue Metoprolol 25mg  PO BID - ASA 81mg  daily  # Bipolar disorder/psych: on Depakote, Effexor, Seroquel, Trazadone, ativan and Haldol  - depakote changed to PO - Continue haldol and ativan as PRN for agitation only  - continue Seroquel 100mg  PO qHS - Hold Effexor and Trazadone to reduce sedation effects, plan to resume on discharge  # Hypertension  - well controlled - continue to hold HCTZ given concerns about dehydration on admission - Metoprolol 25mg  PO BID, and Metoprolol IV PRN - continue Amlodipine 10mg  daily - hydralazine 5mg  IV PRN for SBP >180   # Hypokalemia: Resolved.  FEN/GI: Dysphagia 1 diet / nectar thick, crush meds / SLIV  Prophylaxis: Heparin subq  Disposition: pending work up for initial fever / elevated WBC without etiology, remains AFib rate controlled, improved acute encephalopathy, tolerating PO and on PO antibiotics, anticipate discharge back to SNF today - c/s CSW - re: initial plan to discharge back to prior ALF, however per accepting physician they feel that pt will require SNF care given decreased independence - CSW to contact pt's legal public guardian and will arrange for locating SNF bed offers, anticipate DC in 1-2 days  Subjective:  Feels well today. Denies any pain or complaints. Improved  appropriate verbal responses. Tolerating PO. Appears to be approaching mental status baseline.  Objective: Temp:  [97.4 F (36.3 C)-98.7 F (37.1 C)] 98.4 F (36.9 C) (06/30 0500) Pulse Rate:  [86-106] 106 (06/30 0500) Resp:  [16-18] 18  (06/30 0500) BP: (121-162)/(58-80) 121/58 mmHg (06/30 0500) SpO2:  [96 %-100 %] 96 % (06/30 0500) Weight:  [155 lb 10.3 oz (70.6 kg)] 155 lb 10.3 oz (70.6 kg) (06/29 2034) Physical Exam: Gen: resting in bed, calm and comfortable, NAD HEENT: NCAT, MMM Resp: CTAB, limited cooperation with exam Heart: RRR, no murmurs Abd: Soft, NT/ND. BS+ and normal.  Ext: bilateral feet in floater boots Neuro: awake, alert, disoriented to year (oriented to name / hospital), follows most commands, some repetitive speech  Laboratory:  Recent Labs Lab 06/16/14 0100 06/17/14 0415 06/18/14 0552  WBC 19.2* 17.2* 12.8*  HGB 10.3* 10.4* 10.1*  HCT 32.2* 32.9* 31.8*  PLT 443* 405* 371    Recent Labs Lab 06/13/14 1220  06/16/14 0743 06/16/14 1718 06/17/14 0415  NA 157*  < > 146 145 142  K 3.6*  < > 4.1 4.0 4.8  CL 112  < > 110 107 107  CO2 32  < > 25 27 24   BUN 50*  < > 19 18 16   CREATININE 0.97  < > 0.66 0.67 0.66  CALCIUM 9.2  < > 7.8* 8.0* 8.1*  PROT 6.3  --   --   --   --   BILITOT 0.3  --   --   --   --   ALKPHOS 80  --   --   --   --   ALT 19  --   --   --   --   AST 24  --   --   --   --   GLUCOSE 167*  < > 127* 100* 115*  < > = values in this interval not displayed. Lactic Acid - 1.91 TSH - 2.680  Urine Osm - 744 Urine Sodium - 45  Imaging/Diagnostic Tests:  (Previous CT Head 05/30/14 - recent ED visit) IMPRESSION:  Chronic atrophy and small vessel ischemic changes. Suggestion of  focal acute infarct in the left posterior temporal region. No  significant mass effect. No acute intracranial hemorrhage. Stable  appearance of calcified extra-axial lesion in the left posterior  cranial fossa, possibly meningioma.  6/25 CXR Portable 1v IMPRESSION:  No active disease. Chest is stable from prior exam.  6/25 CT Abd IMPRESSION:  Mild apparent right hydronephrosis and extra renal pelvis which may  be better characterized with ultrasound as clinically indicated.  Foley catheter  decompresses urinary bladder.  Large amount of stool projecting at rectal vault without bowel  obstruction.  06/14/2014 BL foot XR IMPRESSION:  Diffuse osteopenia. No acute fracture or subluxation. No evidence of  Osteomyelitis.  6/28 CXR 2v IMPRESSION:  No acute cardiopulmonary disease.  Saralyn PilarAlexander Karamalegos, DO 06/18/2014, 8:35 AM PGY-1, [facility] FPTS Intern pager: [phone], text pages welcome

## 2014-06-19 LAB — CULTURE, BLOOD (ROUTINE X 2)
CULTURE: NO GROWTH
Culture: NO GROWTH

## 2014-06-19 NOTE — Discharge Summary (Signed)
.  Family Medicine Teaching Service  Discharge Note : Attending Sara Neal MD Pager [phone] Office [phone] I have seen and examined this patient, reviewed their chart and discussed discharge planning with the resident at the time of discharge. I agree with the discharge plan as above.  

## 2014-06-20 ENCOUNTER — Non-Acute Institutional Stay (SKILLED_NURSING_FACILITY): Payer: Medicare Other | Admitting: Internal Medicine

## 2014-06-20 DIAGNOSIS — I10 Essential (primary) hypertension: Secondary | ICD-10-CM

## 2014-06-20 DIAGNOSIS — F0391 Unspecified dementia with behavioral disturbance: Secondary | ICD-10-CM

## 2014-06-20 DIAGNOSIS — F319 Bipolar disorder, unspecified: Secondary | ICD-10-CM

## 2014-06-20 DIAGNOSIS — L0231 Cutaneous abscess of buttock: Secondary | ICD-10-CM

## 2014-06-20 DIAGNOSIS — R6510 Systemic inflammatory response syndrome (SIRS) of non-infectious origin without acute organ dysfunction: Secondary | ICD-10-CM

## 2014-06-20 DIAGNOSIS — L03317 Cellulitis of buttock: Secondary | ICD-10-CM

## 2014-06-20 DIAGNOSIS — I482 Chronic atrial fibrillation, unspecified: Secondary | ICD-10-CM

## 2014-06-20 DIAGNOSIS — E870 Hyperosmolality and hypernatremia: Secondary | ICD-10-CM

## 2014-06-20 DIAGNOSIS — I4891 Unspecified atrial fibrillation: Secondary | ICD-10-CM

## 2014-06-20 DIAGNOSIS — F03918 Unspecified dementia, unspecified severity, with other behavioral disturbance: Secondary | ICD-10-CM

## 2014-06-22 ENCOUNTER — Encounter: Payer: Self-pay | Admitting: Internal Medicine

## 2014-06-22 DIAGNOSIS — I4891 Unspecified atrial fibrillation: Secondary | ICD-10-CM | POA: Insufficient documentation

## 2014-06-22 DIAGNOSIS — I10 Essential (primary) hypertension: Secondary | ICD-10-CM | POA: Insufficient documentation

## 2014-06-22 DIAGNOSIS — F319 Bipolar disorder, unspecified: Secondary | ICD-10-CM | POA: Insufficient documentation

## 2014-06-22 DIAGNOSIS — F03918 Unspecified dementia, unspecified severity, with other behavioral disturbance: Secondary | ICD-10-CM | POA: Insufficient documentation

## 2014-06-22 DIAGNOSIS — F0391 Unspecified dementia with behavioral disturbance: Secondary | ICD-10-CM | POA: Insufficient documentation

## 2014-06-22 NOTE — Assessment & Plan Note (Signed)
Continue norvasc 10 MG, HCTZ 25 MG and Lopressor 25 mg daily

## 2014-06-22 NOTE — Assessment & Plan Note (Signed)
No identified source of infection. To complete Levaquin 750mg  PO qod, skip 7/1, and last dose 06/20/14. Low threshold to repeat Chest Xray if concern, initially concern for aspiration PNA, however negative X-rays and normal lung exam

## 2014-06-22 NOTE — Progress Notes (Signed)
MRN: [id number] Name: Jenny Patterson  Sex: female Age: 80 y.o. DOB: 06-24-1927  PSC #: [facility] Facility/Room: 120A Level Of Care: SNF Provider: Merrilee Seashore D Emergency Contacts: Extended Emergency Contact Information Primary Emergency Contact: Smith,Gwen  [place] Home Phone: [phone] Relation: Sister  Code Status:   Allergies: Review of patient's allergies indicates no known allergies.  Chief Complaint  Patient presents with  . nursing home admission    HPI: Patient is 80 y.o. female who is admitted to SNF after being hospitalized with SIRS and hypernatremia.  Past Medical History  Diagnosis Date  . Alzheimer disease   . Dementia   . Bipolar 1 disorder   . Hypertension     Past Surgical History  Procedure Laterality Date  . Cholecystectomy    . Abdominal hysterectomy      partial      Medication List       This list is accurate as of: 06/20/14 11:59 PM.  Always use your most recent med list.               alum & mag hydroxide-simeth 200-200-20 MG/5ML suspension  Commonly known as:  MAALOX/MYLANTA  Take 30 mLs by mouth 4 (four) times daily as needed for indigestion or heartburn.     amLODipine 10 MG tablet  Commonly known as:  NORVASC  Take 1 tablet (10 mg total) by mouth daily.     divalproex 250 MG DR tablet  Commonly known as:  DEPAKOTE  Take 1 tablet (250 mg total) by mouth 4 (four) times daily.     guaifenesin 100 MG/5ML syrup  Commonly known as:  ROBITUSSIN  Take 10 mLs (200 mg total) by mouth every 6 (six) hours as needed for cough.     haloperidol 1 MG tablet  Commonly known as:  HALDOL  Take 1 tablet (1 mg total) by mouth every 8 (eight) hours.     hydrochlorothiazide 25 MG tablet  Commonly known as:  HYDRODIURIL  Take 1 tablet (25 mg total) by mouth daily.     levofloxacin 750 MG tablet  Commonly known as:  LEVAQUIN  Take 750 mg by mouth every other day. Last dose 7/2     levofloxacin 750 MG tablet  Commonly  known as:  LEVAQUIN  Take 1 tablet (750 mg total) by mouth every other day. Start on 06/18/14, skip 06/19/14, last dose 06/20/14     loperamide 2 MG capsule  Commonly known as:  IMODIUM  Take 1 capsule (2 mg total) by mouth as needed for diarrhea or loose stools.     LORazepam 0.5 MG tablet  Commonly known as:  ATIVAN  Take 0.5 tablets (0.25 mg total) by mouth 2 (two) times daily as needed (agitation).     magnesium hydroxide 400 MG/5ML suspension  Commonly known as:  MILK OF MAGNESIA  Take 30 mLs by mouth at bedtime as needed for mild constipation.     metoprolol tartrate 25 MG tablet  Commonly known as:  LOPRESSOR  Take 1 tablet (25 mg total) by mouth daily.     QUEtiapine 100 MG tablet  Commonly known as:  SEROQUEL  Take 1 tablet (100 mg total) by mouth at bedtime.     traZODone 50 MG tablet  Commonly known as:  DESYREL  Take 1 tablet (50 mg total) by mouth at bedtime.     venlafaxine 37.5 MG tablet  Commonly known as:  EFFEXOR  Take 1 tablet (37.5 mg total) by  mouth 3 (three) times daily with meals.        Meds ordered this encounter  Medications  . levofloxacin (LEVAQUIN) 750 MG tablet    Sig: Take 750 mg by mouth every other day. Last dose 7/2     There is no immunization history on file for this patient.  History  Substance Use Topics  . Smoking status: Never Smoker   . Smokeless tobacco: Not on file  . Alcohol Use: No    Family history is noncontributory    Review of Systems  DATA OBTAINED: UTO from pt 2/2 dementia;from, nurse- only concern is a swelling R buttocks   Filed Vitals:   06/20/14 2201  BP: 132/79  Pulse: 75  Temp: 97.9 F (36.6 C)  Resp: 20    Physical Exam  GENERAL APPEARANCE: Alert, nonconversant. Appropriately groomed. No acute distress.  SKIN: R buttocks firm swelling with minimal fluctuance with some redness and heat HEAD: Normocephalic, atraumatic  EYES: Conjunctiva/lids clear. Pupils round, reactive. EOMs intact.  EARS:  External exam WNL, canals clear. Hearing grossly normal.  NOSE: No deformity or discharge.  MOUTH/THROAT: Lips w/o lesions  RESPIRATORY: Breathing is even, unlabored. Lung sounds are clear   CARDIOVASCULAR: Heart RRR no murmurs, rubs or gallops. No peripheral edema.   GASTROINTESTINAL: Abdomen is soft, non-tender, not distended w/ normal bowel sounds GENITOURINARY: Bladder non tender, not distended  MUSCULOSKELETAL: No abnormal joints or musculature NEUROLOGIC:  Cranial nerves 2-12 grossly intact. PSYCHIATRIC: dementia affect, no behavioral issues  Patient Active Problem List   Diagnosis Date Noted  . Atrial fibrillation 06/22/2014  . Dementia with behavioral disturbance   . Bipolar 1 disorder   . Hypertension   . Hypernatremia 06/13/2014  . SIRS (systemic inflammatory response syndrome) 06/13/2014    CBC    Component Value Date/Time   WBC 12.8* 06/18/2014 0552   RBC 3.20* 06/18/2014 0552   HGB 10.1* 06/18/2014 0552   HCT 31.8* 06/18/2014 0552   PLT 371 06/18/2014 0552   MCV 99.4 06/18/2014 0552   LYMPHSABS 2.1 06/15/2014 0429   MONOABS 1.9* 06/15/2014 0429   EOSABS 0.1 06/15/2014 0429   BASOSABS 0.0 06/15/2014 0429    CMP     Component Value Date/Time   NA 142 06/17/2014 0415   K 4.8 06/17/2014 0415   CL 107 06/17/2014 0415   CO2 24 06/17/2014 0415   GLUCOSE 115* 06/17/2014 0415   BUN 16 06/17/2014 0415   CREATININE 0.66 06/17/2014 0415   CALCIUM 8.1* 06/17/2014 0415   PROT 6.3 06/13/2014 1220   ALBUMIN 1.8* 06/13/2014 1220   AST 24 06/13/2014 1220   ALT 19 06/13/2014 1220   ALKPHOS 80 06/13/2014 1220   BILITOT 0.3 06/13/2014 1220   GFRNONAA 78* 06/17/2014 0415   GFRAA >90 06/17/2014 0415    Assessment and Plan  SIRS (systemic inflammatory response syndrome) No identified source of infection. To complete Levaquin 750mg  PO qod, skip 7/1, and last dose 06/20/14. Low threshold to repeat Chest Xray if concern, initially concern for aspiration PNA, however negative X-rays and normal lung  exam   Hypernatremia Admission 159 down to 142 on discharge. Consider repeating BMET within 3-5 days, otherwise as long as taking good PO should remain stable   Atrial fibrillation Continue rate control with Metoprolol, and daily ASA 81mg , not candidate for anticoagulation   Hypertension Continue norvasc 10 MG, HCTZ 25 MG and Lopressor 25 mg daily  Dementia with behavioral disturbance Multiple meds for dementia and bipolar dx -  depakote, haldol, seroquel, trazodone, effexor  Bipolar 1 disorder See above dementia  ABSCESS R BUTTOCKS- not ripe; start doxy 100 mg BID for 10 days and consult wound nurse; wound MD or center as problem progresses if needed  Margit HanksALEXANDER, ANNE D, MD

## 2014-06-22 NOTE — Assessment & Plan Note (Signed)
See above dementia

## 2014-06-22 NOTE — Assessment & Plan Note (Signed)
Continue rate control with Metoprolol, and daily ASA 81mg , not candidate for anticoagulation

## 2014-06-22 NOTE — Assessment & Plan Note (Signed)
Admission 159 down to 142 on discharge. Consider repeating BMET within 3-5 days, otherwise as long as taking good PO should remain stable

## 2014-06-22 NOTE — Assessment & Plan Note (Addendum)
Multiple meds for dementia and bipolar dx - depakote, haldol, seroquel, trazodone, effexor

## 2014-07-01 ENCOUNTER — Non-Acute Institutional Stay (SKILLED_NURSING_FACILITY): Payer: Medicare Other | Admitting: Internal Medicine

## 2014-07-01 DIAGNOSIS — R1310 Dysphagia, unspecified: Secondary | ICD-10-CM

## 2014-07-01 DIAGNOSIS — R627 Adult failure to thrive: Secondary | ICD-10-CM

## 2014-07-01 DIAGNOSIS — L8990 Pressure ulcer of unspecified site, unspecified stage: Secondary | ICD-10-CM

## 2014-07-01 DIAGNOSIS — F03918 Unspecified dementia, unspecified severity, with other behavioral disturbance: Secondary | ICD-10-CM

## 2014-07-01 DIAGNOSIS — F0391 Unspecified dementia with behavioral disturbance: Secondary | ICD-10-CM

## 2014-07-01 NOTE — Progress Notes (Signed)
MRN: [id number] Name: Jenny Patterson  Sex: female Age: 80 y.o. DOB: Oct 05, 1927  PSC #: [facility] Facility/Room: 120 Level Of Care: SNF Harshika Mago: Merrilee Seashore D Emergency Contacts: Extended Emergency Contact Information Primary Emergency Contact: [facility] Address: [address]          [place], [place] [address] Darden [place] of [place] Home Phone: [phone] Relation: Legal Guardian Secondary Emergency Contact: Heidi Dach, [place] Home Phone: [phone] Relation: Other  Code Status: FULL  Allergies: Review of patient's allergies indicates no known allergies.  Chief Complaint  Patient presents with  . Medical Management of Chronic Issues    HPI: Patient is 80 y.o. female who is being seen for FTT after I conferenced with 2 different nursing staff. Not swallowing medications and decubitis becoming necrotic.  Past Medical History  Diagnosis Date  . Alzheimer disease   . Dementia   . Bipolar 1 disorder   . Hypertension     Past Surgical History  Procedure Laterality Date  . Cholecystectomy    . Abdominal hysterectomy      partial      Medication List       This list is accurate as of: 07/01/14 11:59 PM.  Always use your most recent med list.               alum & mag hydroxide-simeth 200-200-20 MG/5ML suspension  Commonly known as:  MAALOX/MYLANTA  Take 30 mLs by mouth 4 (four) times daily as needed for indigestion or heartburn.     amLODipine 10 MG tablet  Commonly known as:  NORVASC  Take 1 tablet (10 mg total) by mouth daily.     divalproex 250 MG DR tablet  Commonly known as:  DEPAKOTE  Take 1 tablet (250 mg total) by mouth 4 (four) times daily.     guaifenesin 100 MG/5ML syrup  Commonly known as:  ROBITUSSIN  Take 10 mLs (200 mg total) by mouth every 6 (six) hours as needed for cough.     haloperidol 1 MG tablet  Commonly known as:  HALDOL  Take 1 tablet (1 mg total) by mouth every 8 (eight)  hours.     hydrochlorothiazide 25 MG tablet  Commonly known as:  HYDRODIURIL  Take 1 tablet (25 mg total) by mouth daily.     levofloxacin 750 MG tablet  Commonly known as:  LEVAQUIN  Take 750 mg by mouth every other day. Last dose 7/2     loperamide 2 MG capsule  Commonly known as:  IMODIUM  Take 1 capsule (2 mg total) by mouth as needed for diarrhea or loose stools.     LORazepam 0.5 MG tablet  Commonly known as:  ATIVAN  Take 0.5 tablets (0.25 mg total) by mouth 2 (two) times daily as needed (agitation).     magnesium hydroxide 400 MG/5ML suspension  Commonly known as:  MILK OF MAGNESIA  Take 30 mLs by mouth at bedtime as needed for mild constipation.     metoprolol tartrate 25 MG tablet  Commonly known as:  LOPRESSOR  Take 1 tablet (25 mg total) by mouth daily.     QUEtiapine 100 MG tablet  Commonly known as:  SEROQUEL  Take 1 tablet (100 mg total) by mouth at bedtime.     traZODone 50 MG tablet  Commonly known as:  DESYREL  Take 1 tablet (50 mg total) by mouth at bedtime.  venlafaxine 37.5 MG tablet  Commonly known as:  EFFEXOR  Take 1 tablet (37.5 mg total) by mouth 3 (three) times daily with meals.        No orders of the defined types were placed in this encounter.     There is no immunization history on file for this patient.  History  Substance Use Topics  . Smoking status: Never Smoker   . Smokeless tobacco: Not on file  . Alcohol Use: No    Review of Systems  DATA OBTAINED: from patient, nurse, medical record, family member GENERAL: Feels well no fevers, fatigue, appetite changes SKIN: No itching, rash HEENT: No complaint RESPIRATORY: No cough, wheezing, SOB CARDIAC: No chest pain, palpitations, lower extremity edema  GI: No abdominal pain, No N/V/D or constipation, No heartburn or reflux  GU: No dysuria, frequency or urgency, or incontinence  MUSCULOSKELETAL: No unrelieved bone/joint pain NEUROLOGIC: No headache, dizziness or focal  weakness PSYCHIATRIC: No overt anxiety or sadness. Sleeps well.   Filed Vitals:   07/01/14 2158  BP: 131/75  Pulse: 87  Temp: 98.4 F (36.9 C)  Resp: 22    Physical Exam  GENERAL APPEARANCE: Alert, conversant. Appropriately groomed. No acute distress  SKIN: No diaphoresis rash, or wounds HEENT: Unremarkable RESPIRATORY: Breathing is even, unlabored. Lung sounds are clear   CARDIOVASCULAR: Heart RRR no murmurs, rubs or gallops. No peripheral edema  GASTROINTESTINAL: Abdomen is soft, non-tender, not distended w/ normal bowel sounds.  GENITOURINARY: Bladder non tender, not distended  MUSCULOSKELETAL: No abnormal joints or musculature NEUROLOGIC: Cranial nerves 2-12 grossly intact. Moves all extremities no tremor. PSYCHIATRIC: Mood and affect appropriate to situation, no behavioral issues  Patient Active Problem List   Diagnosis Date Noted  . FTT (failure to thrive) in adult 07/07/2014  . Dysphagia, unspecified(787.20) 07/07/2014  . Atrial fibrillation 06/22/2014  . Dementia with behavioral disturbance   . Bipolar 1 disorder   . Hypertension   . Hypernatremia 06/13/2014  . SIRS (systemic inflammatory response syndrome) 06/13/2014    CBC    Component Value Date/Time   WBC 12.8* 06/18/2014 0552   RBC 3.20* 06/18/2014 0552   HGB 10.1* 06/18/2014 0552   HCT 31.8* 06/18/2014 0552   PLT 371 06/18/2014 0552   MCV 99.4 06/18/2014 0552   LYMPHSABS 2.1 06/15/2014 0429   MONOABS 1.9* 06/15/2014 0429   EOSABS 0.1 06/15/2014 0429   BASOSABS 0.0 06/15/2014 0429    CMP     Component Value Date/Time   NA 142 06/17/2014 0415   K 4.8 06/17/2014 0415   CL 107 06/17/2014 0415   CO2 24 06/17/2014 0415   GLUCOSE 115* 06/17/2014 0415   BUN 16 06/17/2014 0415   CREATININE 0.66 06/17/2014 0415   CALCIUM 8.1* 06/17/2014 0415   PROT 6.3 06/13/2014 1220   ALBUMIN 1.8* 06/13/2014 1220   AST 24 06/13/2014 1220   ALT 19 06/13/2014 1220   ALKPHOS 80 06/13/2014 1220   BILITOT 0.3 06/13/2014 1220   GFRNONAA  78* 06/17/2014 0415   GFRAA >90 06/17/2014 0415    Assessment and Plan  FTT (failure to thrive) in adult 2/2 age, dementia progression; recently started not swallowing meds. I have spoken to [facility] to get a meeting for pallative care directives; eventually the feeding tube conversation will come up and that would be a poor choice for this pt  Dementia with behavioral disturbance Contributing cause of FTT despite meds dementia is progressive  Dysphagia, unspecified(787.20) Pt now not swallowing pills;nursing have asked me  for liquid meds ans sprinkles;I have asked them to get ST for swallowing study    Margit HanksLEXANDER, ANNE D, MD    MRN: [id number] Name: Jenny Patterson  Sex: female Age: 80 y.o. DOB: [birth year]  PSC #:  Facility/Room: Level Of Care: SNF Mell Guia: Merrilee SeashoreALEXANDER, ANNE D Emergency Contacts: Extended Emergency Contact Information Primary Emergency Contact: [facility] Address: [address]          [place], [place] [address] [place] Home Phone: [phone] Relation: Legal Guardian Secondary Emergency Contact: Heidi DachWilliams,Lavette          [place], [place] Home Phone: [phone] Relation: Other  Code Status:   Allergies: Review of patient's allergies indicates no known allergies.  Chief Complaint  Patient presents with  . Medical Management of Chronic Issues    HPI: Patient is 80 y.o. female who   Past Medical History  Diagnosis Date  . Alzheimer disease   . Dementia   . Bipolar 1 disorder   . Hypertension     Past Surgical History  Procedure Laterality Date  . Cholecystectomy    . Abdominal hysterectomy      partial      Medication List       This list is accurate as of: 07/01/14 11:59 PM.  Always use your most recent med list.               alum & mag hydroxide-simeth 200-200-20 MG/5ML suspension  Commonly known as:  MAALOX/MYLANTA  Take 30 mLs by mouth 4 (four) times daily as needed for indigestion or  heartburn.     amLODipine 10 MG tablet  Commonly known as:  NORVASC  Take 1 tablet (10 mg total) by mouth daily.     divalproex 250 MG DR tablet  Commonly known as:  DEPAKOTE  Take 1 tablet (250 mg total) by mouth 4 (four) times daily.     guaifenesin 100 MG/5ML syrup  Commonly known as:  ROBITUSSIN  Take 10 mLs (200 mg total) by mouth every 6 (six) hours as needed for cough.     haloperidol 1 MG tablet  Commonly known as:  HALDOL  Take 1 tablet (1 mg total) by mouth every 8 (eight) hours.     hydrochlorothiazide 25 MG tablet  Commonly known as:  HYDRODIURIL  Take 1 tablet (25 mg total) by mouth daily.     levofloxacin 750 MG tablet  Commonly known as:  LEVAQUIN  Take 750 mg by mouth every other day. Last dose 7/2     loperamide 2 MG capsule  Commonly known as:  IMODIUM  Take 1 capsule (2 mg total) by mouth as needed for diarrhea or loose stools.     LORazepam 0.5 MG tablet  Commonly known as:  ATIVAN  Take 0.5 tablets (0.25 mg total) by mouth 2 (two) times daily as needed (agitation).     magnesium hydroxide 400 MG/5ML suspension  Commonly known as:  MILK OF MAGNESIA  Take 30 mLs by mouth at bedtime as needed for mild constipation.     metoprolol tartrate 25 MG tablet  Commonly known as:  LOPRESSOR  Take 1 tablet (25 mg total) by mouth daily.     QUEtiapine 100 MG tablet  Commonly known as:  SEROQUEL  Take 1 tablet (100 mg total) by mouth at bedtime.     traZODone 50 MG tablet  Commonly known as:  DESYREL  Take 1 tablet (50 mg total) by mouth at bedtime.  venlafaxine 37.5 MG tablet  Commonly known as:  EFFEXOR  Take 1 tablet (37.5 mg total) by mouth 3 (three) times daily with meals.        No orders of the defined types were placed in this encounter.     There is no immunization history on file for this patient.  History  Substance Use Topics  . Smoking status: Never Smoker   . Smokeless tobacco: Not on file  . Alcohol Use: No    Review of  Systems  From wound nurse -ulcers are getting necrotic; floor nursing -pt not swallowing pills     Filed Vitals:   07/01/14 2158  BP: 131/75  Pulse: 87  Temp: 98.4 F (36.9 C)  Resp: 22    Physical Exam  GENERAL APPEARANCE: Alert, minconversant. Appropriately groomed. No acute distress  SKIN: No diaphoresis rash; wound dressed HEENT: Unremarkable RESPIRATORY: Breathing is even, unlabored. Lung sounds are clear   CARDIOVASCULAR: Heart RRR no murmurs, rubs or gallops. No peripheral edema  GASTROINTESTINAL: Abdomen is soft, non-tender, not distended w/ normal bowel sounds.  GENITOURINARY: Bladder non tender, not distended  MUSCULOSKELETAL: No abnormal joints or musculature NEUROLOGIC: Cranial nerves 2-12 grossly intact. Moves all extremities no tremor. PSYCHIATRIC: dementia no behavioral issues  Patient Active Problem List   Diagnosis Date Noted  . FTT (failure to thrive) in adult 07/07/2014  . Dysphagia, unspecified(787.20) 07/07/2014  . Atrial fibrillation 06/22/2014  . Dementia with behavioral disturbance   . Bipolar 1 disorder   . Hypertension   . Hypernatremia 06/13/2014  . SIRS (systemic inflammatory response syndrome) 06/13/2014    CBC    Component Value Date/Time   WBC 12.8* 06/18/2014 0552   RBC 3.20* 06/18/2014 0552   HGB 10.1* 06/18/2014 0552   HCT 31.8* 06/18/2014 0552   PLT 371 06/18/2014 0552   MCV 99.4 06/18/2014 0552   LYMPHSABS 2.1 06/15/2014 0429   MONOABS 1.9* 06/15/2014 0429   EOSABS 0.1 06/15/2014 0429   BASOSABS 0.0 06/15/2014 0429    CMP     Component Value Date/Time   NA 142 06/17/2014 0415   K 4.8 06/17/2014 0415   CL 107 06/17/2014 0415   CO2 24 06/17/2014 0415   GLUCOSE 115* 06/17/2014 0415   BUN 16 06/17/2014 0415   CREATININE 0.66 06/17/2014 0415   CALCIUM 8.1* 06/17/2014 0415   PROT 6.3 06/13/2014 1220   ALBUMIN 1.8* 06/13/2014 1220   AST 24 06/13/2014 1220   ALT 19 06/13/2014 1220   ALKPHOS 80 06/13/2014 1220   BILITOT 0.3 06/13/2014 1220    GFRNONAA 78* 06/17/2014 0415   GFRAA >90 06/17/2014 0415    Assessment and Plan  FTT (failure to thrive) in adult 2/2 age, dementia progression; recently started not swallowing meds. I have spoken to [facility] to get a meeting for pallative care directives; eventually the feeding tube conversation will come up and that would be a poor choice for this pt  Dementia with behavioral disturbance Contributing cause of FTT despite meds dementia is progressive  Dysphagia, unspecified(787.20) Pt now not swallowing pills;nursing have asked me for liquid meds ans sprinkles;I have asked them to get ST for swallowing study   Margit Hanks, MD

## 2014-07-07 ENCOUNTER — Encounter: Payer: Self-pay | Admitting: Internal Medicine

## 2014-07-07 DIAGNOSIS — R1310 Dysphagia, unspecified: Secondary | ICD-10-CM | POA: Insufficient documentation

## 2014-07-07 DIAGNOSIS — R627 Adult failure to thrive: Secondary | ICD-10-CM | POA: Insufficient documentation

## 2014-07-07 DIAGNOSIS — L8990 Pressure ulcer of unspecified site, unspecified stage: Secondary | ICD-10-CM | POA: Insufficient documentation

## 2014-07-07 NOTE — Assessment & Plan Note (Addendum)
2/2 age, dementia progression; recently started not swallowing meds. [facility] care [facility]; eventually the feeding tube conversation will come up and that would be a poor choice for this pt

## 2014-07-07 NOTE — Assessment & Plan Note (Signed)
Pt now not swallowing pills;nursing have asked me for liquid meds ans sprinkles;I have asked them to get ST for swallowing study

## 2014-07-07 NOTE — Assessment & Plan Note (Signed)
Contributing cause of FTT despite meds dementia is progressive

## 2014-07-11 ENCOUNTER — Encounter: Payer: Self-pay | Admitting: Internal Medicine

## 2014-07-11 ENCOUNTER — Non-Acute Institutional Stay (SKILLED_NURSING_FACILITY): Payer: Medicare Other | Admitting: Internal Medicine

## 2014-07-11 DIAGNOSIS — R627 Adult failure to thrive: Secondary | ICD-10-CM

## 2014-07-11 DIAGNOSIS — F319 Bipolar disorder, unspecified: Secondary | ICD-10-CM

## 2014-07-11 DIAGNOSIS — F03918 Unspecified dementia, unspecified severity, with other behavioral disturbance: Secondary | ICD-10-CM

## 2014-07-11 DIAGNOSIS — F0391 Unspecified dementia with behavioral disturbance: Secondary | ICD-10-CM

## 2014-07-11 NOTE — Assessment & Plan Note (Signed)
Contributing factor to pt's decline

## 2014-07-11 NOTE — Progress Notes (Signed)
MRN: [id number] Name: Jenny Patterson  Sex: female Age: 80 y.o. DOB: 1927/09/08  PSC #: [facility] Facility/Room: 301B Level Of Care: SNF Provider: Merrilee Seashore D Emergency Contacts: Extended Emergency Contact Information Primary Emergency Contact: [facility] Address: [address]          [place], [place] [address] D[place] of [place] Home Phone: [phone] Relation: Legal Guardian Secondary Emergency Contact: Heidi Dach, [place] Home Phone: [phone] Relation: Other  Code Status: FULL  Allergies: Review of patient's allergies indicates no known allergies.  Chief Complaint  Patient presents with  . Medical Management of Chronic Issues    HPI: Patient is 80 y.o. female who is being seen today for concern over progressive decline.  Past Medical History  Diagnosis Date  . Alzheimer disease   . Dementia   . Bipolar 1 disorder   . Hypertension     Past Surgical History  Procedure Laterality Date  . Cholecystectomy    . Abdominal hysterectomy      partial      Medication List       This list is accurate as of: 07/11/14  8:27 PM.  Always use your most recent med list.               alum & mag hydroxide-simeth 200-200-20 MG/5ML suspension  Commonly known as:  MAALOX/MYLANTA  Take 30 mLs by mouth 4 (four) times daily as needed for indigestion or heartburn.     amLODipine 10 MG tablet  Commonly known as:  NORVASC  Take 1 tablet (10 mg total) by mouth daily.     divalproex 250 MG DR tablet  Commonly known as:  DEPAKOTE  Take 1 tablet (250 mg total) by mouth 4 (four) times daily.     guaifenesin 100 MG/5ML syrup  Commonly known as:  ROBITUSSIN  Take 10 mLs (200 mg total) by mouth every 6 (six) hours as needed for cough.     haloperidol 1 MG tablet  Commonly known as:  HALDOL  Take 1 tablet (1 mg total) by mouth every 8 (eight) hours.     hydrochlorothiazide 25 MG tablet  Commonly known as:   HYDRODIURIL  Take 1 tablet (25 mg total) by mouth daily.     loperamide 2 MG capsule  Commonly known as:  IMODIUM  Take 1 capsule (2 mg total) by mouth as needed for diarrhea or loose stools.     LORazepam 0.5 MG tablet  Commonly known as:  ATIVAN  Take 0.5 tablets (0.25 mg total) by mouth 2 (two) times daily as needed (agitation).     magnesium hydroxide 400 MG/5ML suspension  Commonly known as:  MILK OF MAGNESIA  Take 30 mLs by mouth at bedtime as needed for mild constipation.     metoprolol tartrate 25 MG tablet  Commonly known as:  LOPRESSOR  Take 1 tablet (25 mg total) by mouth daily.     QUEtiapine 100 MG tablet  Commonly known as:  SEROQUEL  Take 1 tablet (100 mg total) by mouth at bedtime.     traZODone 50 MG tablet  Commonly known as:  DESYREL  Take 1 tablet (50 mg total) by mouth at bedtime.     venlafaxine 37.5 MG tablet  Commonly known as:  EFFEXOR  Take 1 tablet (37.5 mg total) by mouth 3 (three) times daily with meals.        No orders of the  defined types were placed in this encounter.     There is no immunization history on file for this patient.  History  Substance Use Topics  . Smoking status: Never Smoker   . Smokeless tobacco: Not on file  . Alcohol Use: No    Review of Systems  DATA OBTAINED: from nurse, GENERAL: appears comfortable but no po intake SKIN: No itching, rash HEENT: No complaint RESPIRATORY: No cough, wheezing, SOB CARDIAC: No chest pain, palpitations, lower extremity edema  GI: No abdominal pain, No N/V/D, some constipation  GU: + incontinence  MUSCULOSKELETAL: No unrelieved bone/joint pain NEUROLOGIC: depressed sensorium   Filed Vitals:   07/11/14 1952  BP: 141/72  Pulse: 87  Temp: 97.3 F (36.3 C)  Resp: 20    Physical Exam  GENERAL APPEARANCE: non conversant No acute distress  SKIN:  Wound dressed HEENT: Unremarkable RESPIRATORY: Breathing is shallow  CARDIOVASCULAR: Heart irreg, no murmurs, rubs or  gallops. No peripheral edema  GASTROINTESTINAL: Abdomen is soft, non-tender, not distended  GENITOURINARY: Bladder non tender, not distended  MUSCULOSKELETAL: No abnormal joints or musculature NEUROLOGIC: decreased LOC  Patient Active Problem List   Diagnosis Date Noted  . FTT (failure to thrive) in adult 07/07/2014  . Dysphagia, unspecified(787.20) 07/07/2014  . Atrial fibrillation 06/22/2014  . Dementia with behavioral disturbance   . Bipolar 1 disorder   . Hypertension   . Hypernatremia 06/13/2014  . SIRS (systemic inflammatory response syndrome) 06/13/2014    CBC    Component Value Date/Time   WBC 12.8* 06/18/2014 0552   RBC 3.20* 06/18/2014 0552   HGB 10.1* 06/18/2014 0552   HCT 31.8* 06/18/2014 0552   PLT 371 06/18/2014 0552   MCV 99.4 06/18/2014 0552   LYMPHSABS 2.1 06/15/2014 0429   MONOABS 1.9* 06/15/2014 0429   EOSABS 0.1 06/15/2014 0429   BASOSABS 0.0 06/15/2014 0429    CMP     Component Value Date/Time   NA 142 06/17/2014 0415   K 4.8 06/17/2014 0415   CL 107 06/17/2014 0415   CO2 24 06/17/2014 0415   GLUCOSE 115* 06/17/2014 0415   BUN 16 06/17/2014 0415   CREATININE 0.66 06/17/2014 0415   CALCIUM 8.1* 06/17/2014 0415   PROT 6.3 06/13/2014 1220   ALBUMIN 1.8* 06/13/2014 1220   AST 24 06/13/2014 1220   ALT 19 06/13/2014 1220   ALKPHOS 80 06/13/2014 1220   BILITOT 0.3 06/13/2014 1220   GFRNONAA 78* 06/17/2014 0415   GFRAA >90 06/17/2014 0415    Assessment and Plan  FTT (failure to thrive) in adult Pt is not eating or drinking as expected with end stage dementia. Would like to avoid a feeding tube as it would not prolong her life but certainly would prolong her death and create unnecessary suffering. Would like to see pt be made a DNR so COMFORT measures can be instituted.   Dementia with behavioral disturbance Endstage resulting in FTT  Bipolar 1 disorder Contributing factor to pt's decline    Margit HanksALEXANDER, Briley Bumgarner D, MD

## 2014-07-11 NOTE — Assessment & Plan Note (Addendum)
Pt is not eating or drinking much as expected with end stage dementia. Would like to avoid a feeding tube as it would not prolong her life in any meaningful way but  would prolong her death and create unnecessary suffering. Would like to see pt be made a DNR so COMFORT measures can be instituted.

## 2014-07-11 NOTE — Assessment & Plan Note (Signed)
Endstage resulting in FTT

## 2014-08-20 DEATH — deceased

## 2014-08-31 IMAGING — CT CT HEAD W/O CM
1 of 2 series · 15 of 30 positions shown, 19 images · non-contrast
Comparison: CT of the head May 30, 2014 and May 01, 2014

CLINICAL DATA: Altered mental status, recent fall.

EXAM:
CT HEAD WITHOUT CONTRAST
TECHNIQUE: Contiguous axial images were obtained from the base of the skull
through the vertex without intravenous contrast. Due to motion, exam
was repeated with image quality improvement.

[Series 3: head 5.0 h30s · axial · 0.41mm/px · z∈[-134,+6]mm · 15 of 32 slices shown, 19 images]
[im 2/32  brain]
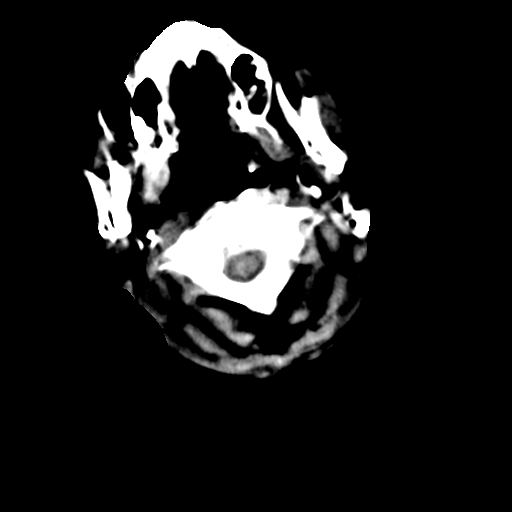
[im 2/32  bone]
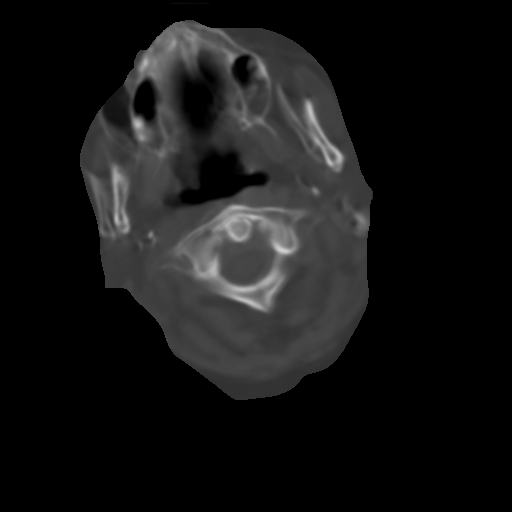
[im 4/32  brain]
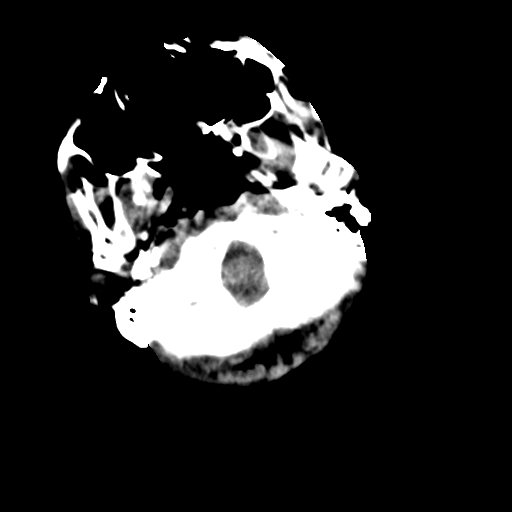
[im 6/32  brain]
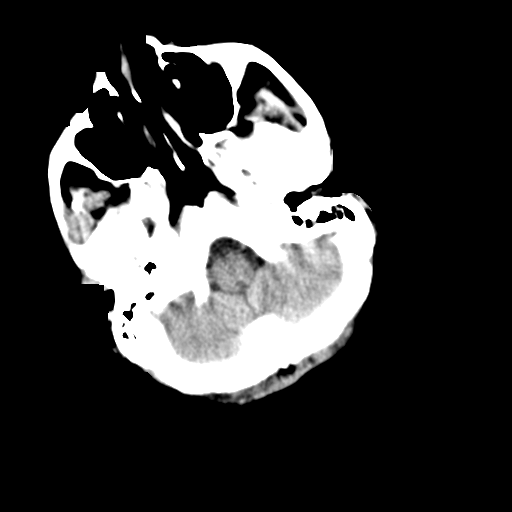
[im 8/32  brain]
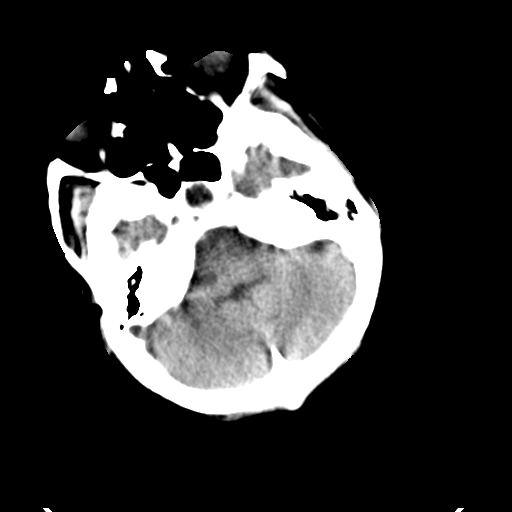
[im 10/32  brain]
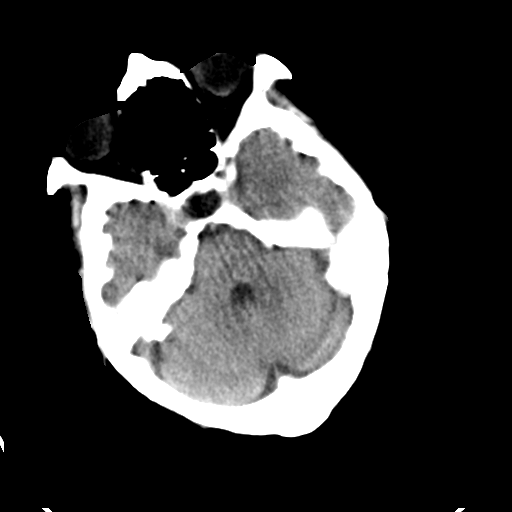
[im 10/32  bone]
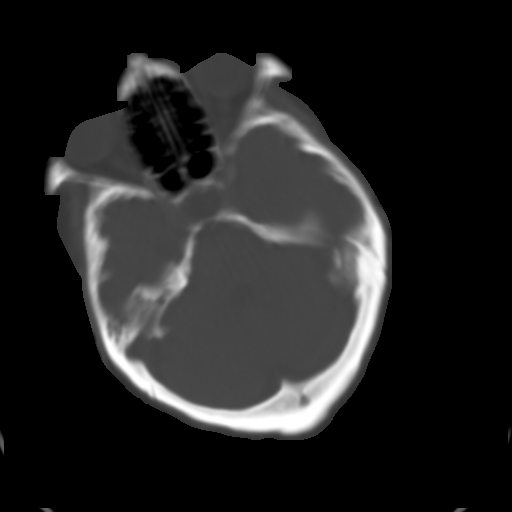
[im 12/32  brain]
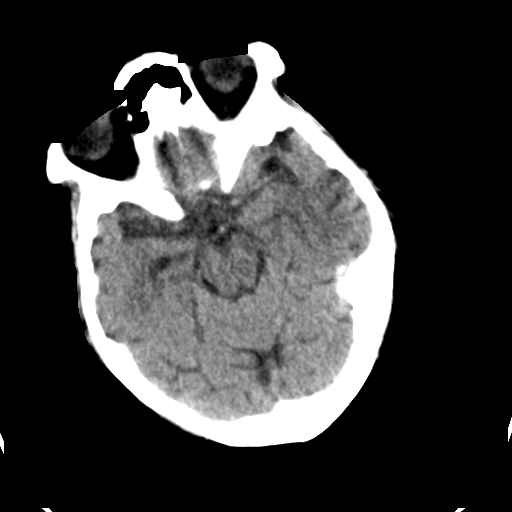
[im 14/32  brain]
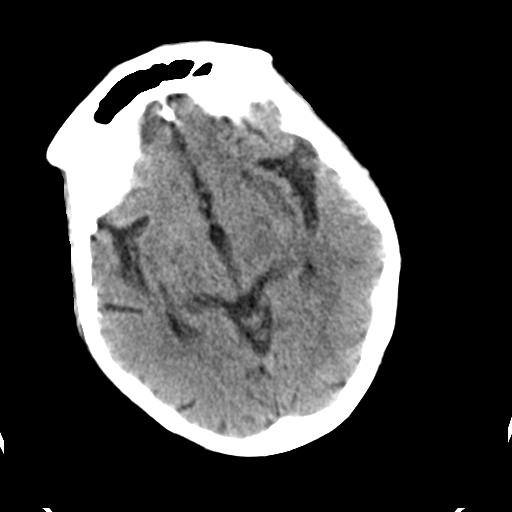
[im 16/32  brain]
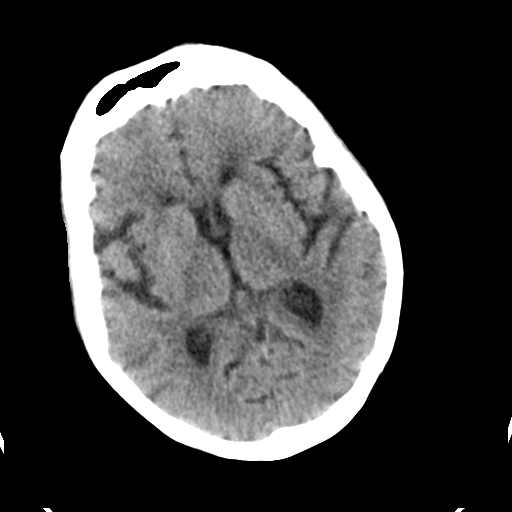
[im 18/32  brain]
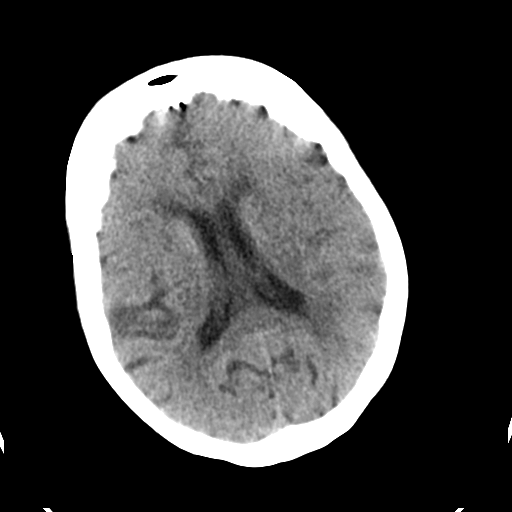
[im 18/32  bone]
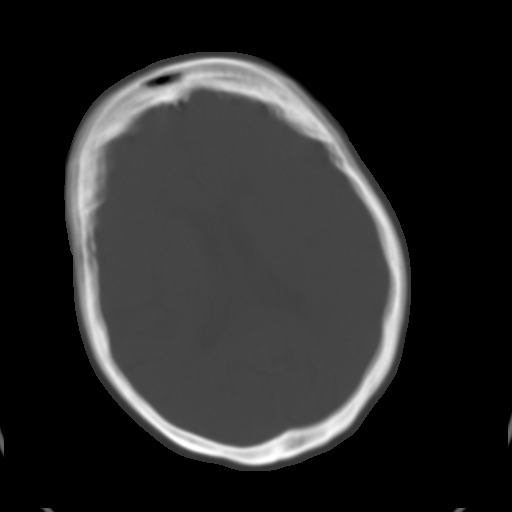
[im 20/32  brain]
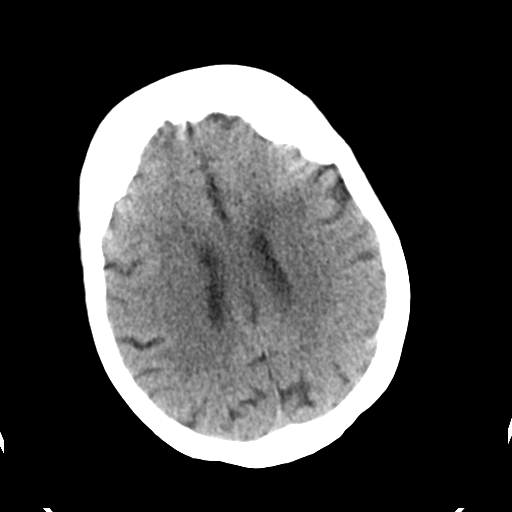
[im 22/32  brain]
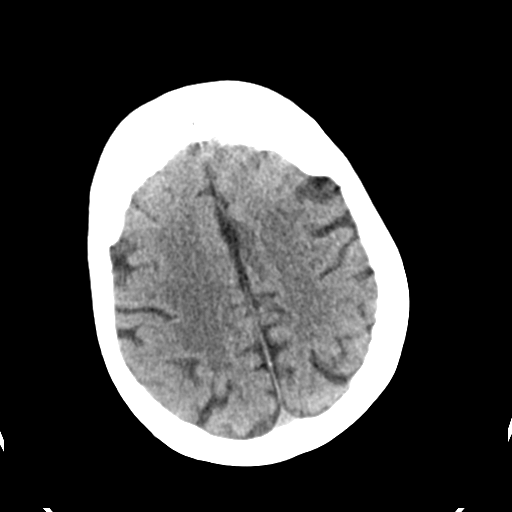
[im 24/32  brain]
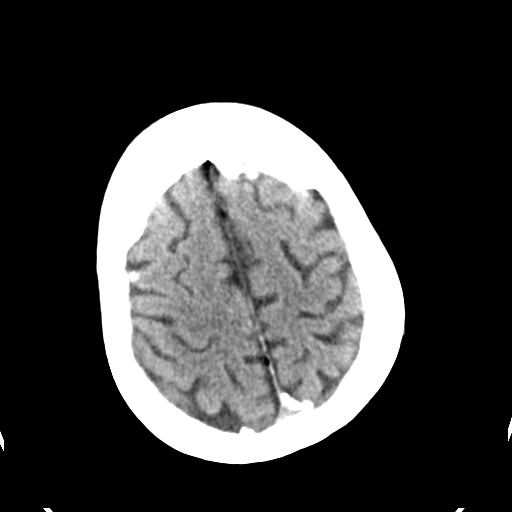
[im 26/32  brain]
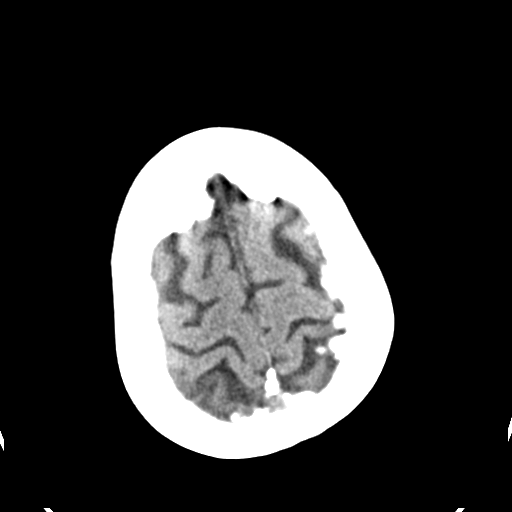
[im 26/32  bone]
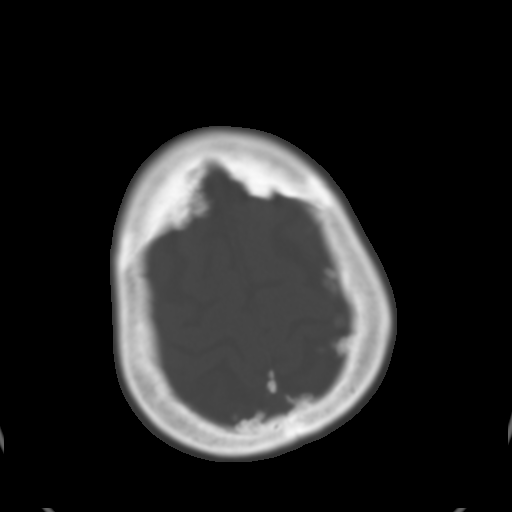
[im 28/32  brain]
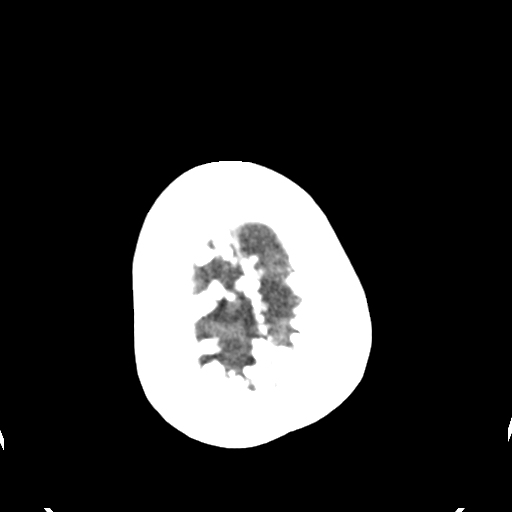
[im 30/32  brain]
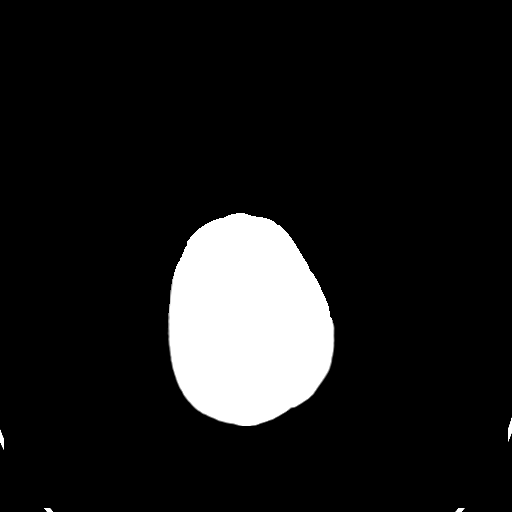

[15 of 30 positions shown; findings below may reference images not displayed]

FINDINGS: The ventricles and sulci are normal for age. No intraparenchymal
hemorrhage, mass effect nor midline shift. Patchy supratentorial
white matter hypodensities are within normal range for patient's age
and though non-specific suggest sequelae of chronic small vessel
ischemic disease. No acute large vascular territory infarcts. Remote
bilateral thalamus and basal ganglia lacunar infarcts. No left
posterior temporal lobe infarct considering prior examination, this
likely was artifact. Left mesial frontal/cingulate cortical
encephalomalacia, unchanged.

No abnormal extra-axial fluid collections. Basal cisterns are
patent. Mild calcific atherosclerosis of the carotid siphons. Stable
calcified 12 x 18 mm left posterior fossa mass likely reflects a
meningioma.

No skull fracture. The included ocular globes and orbital contents
are non-suspicious. The mastoid aircells and included paranasal
sinuses are well-aerated. Expanded fluid filled sella may reflect
empty sella or arachnoid cyst. Soft tissue within the bilateral
external auditory canals may reflect cerumen.
IMPRESSION: No acute intracranial process.

Moderate white matter changes suggest chronic small vessel ischemic
disease in addition to remote bilateral basal ganglia and thalamus
lacunar infarcts. Remote small left anterior cerebral artery
territory infarct.

Stable 12 x 18 mm left posterior fossa calcified mass likely
reflects meningioma.

  By: Starmaxx Maulud

## 2014-08-31 IMAGING — CR DG FOOT 2V*R*
2 series · 2 of 2 positions shown · non-contrast
Comparison: None.

CLINICAL DATA: Ulcer, possible osteomyelitis

EXAM:
RIGHT FOOT - 2 VIEW

[x foot lat right]
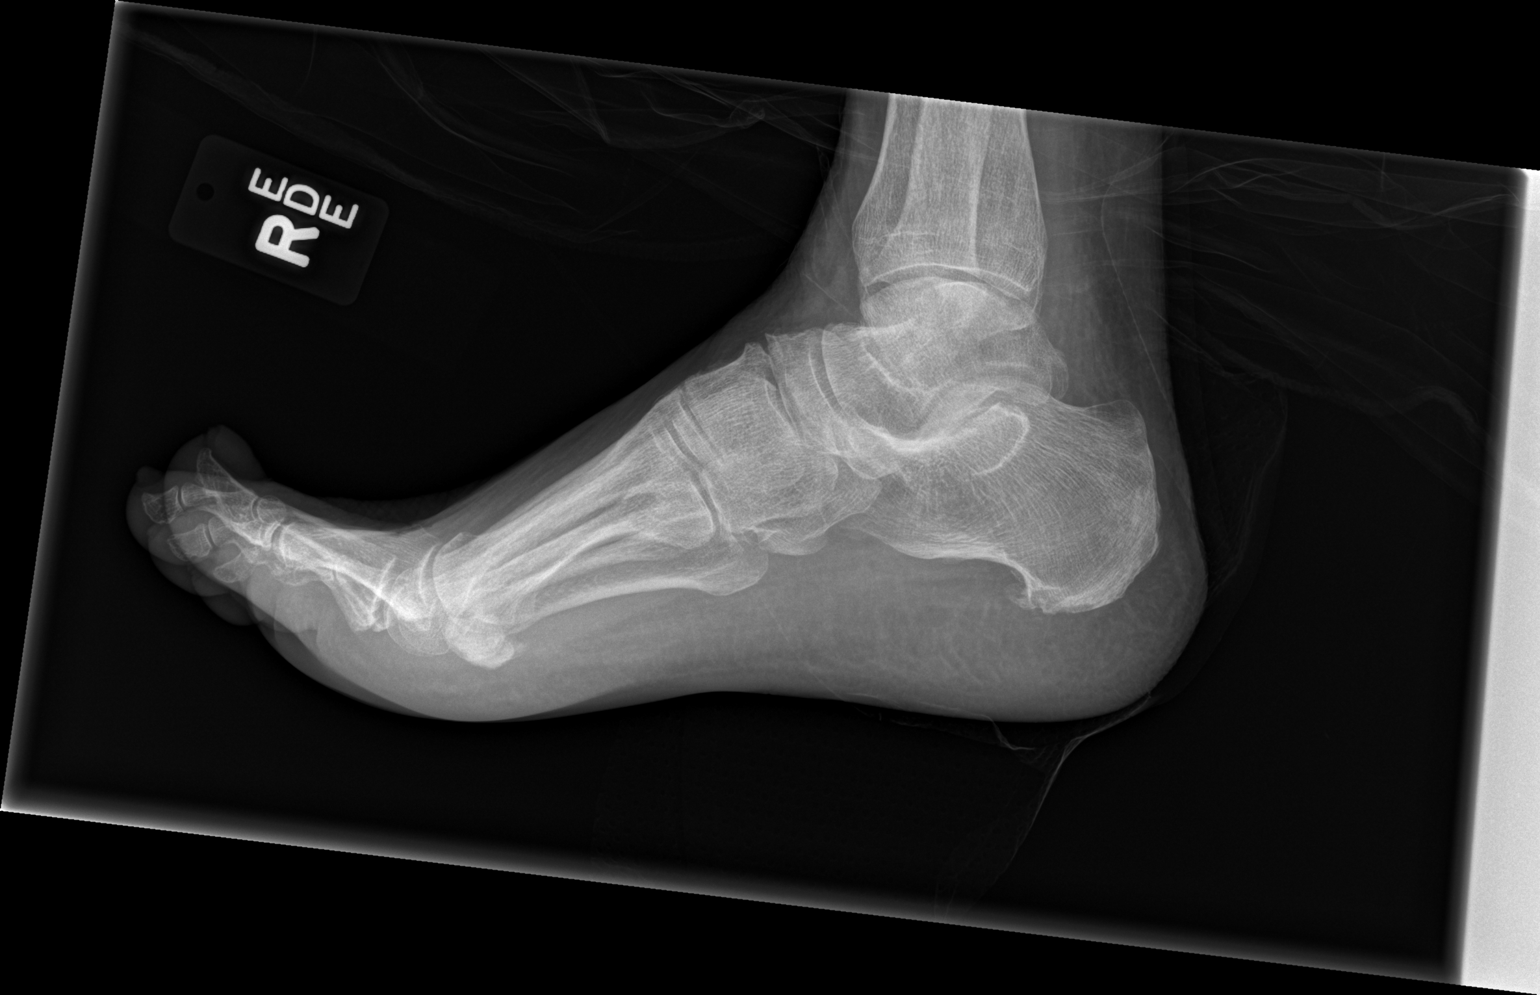

[x foot ap right]
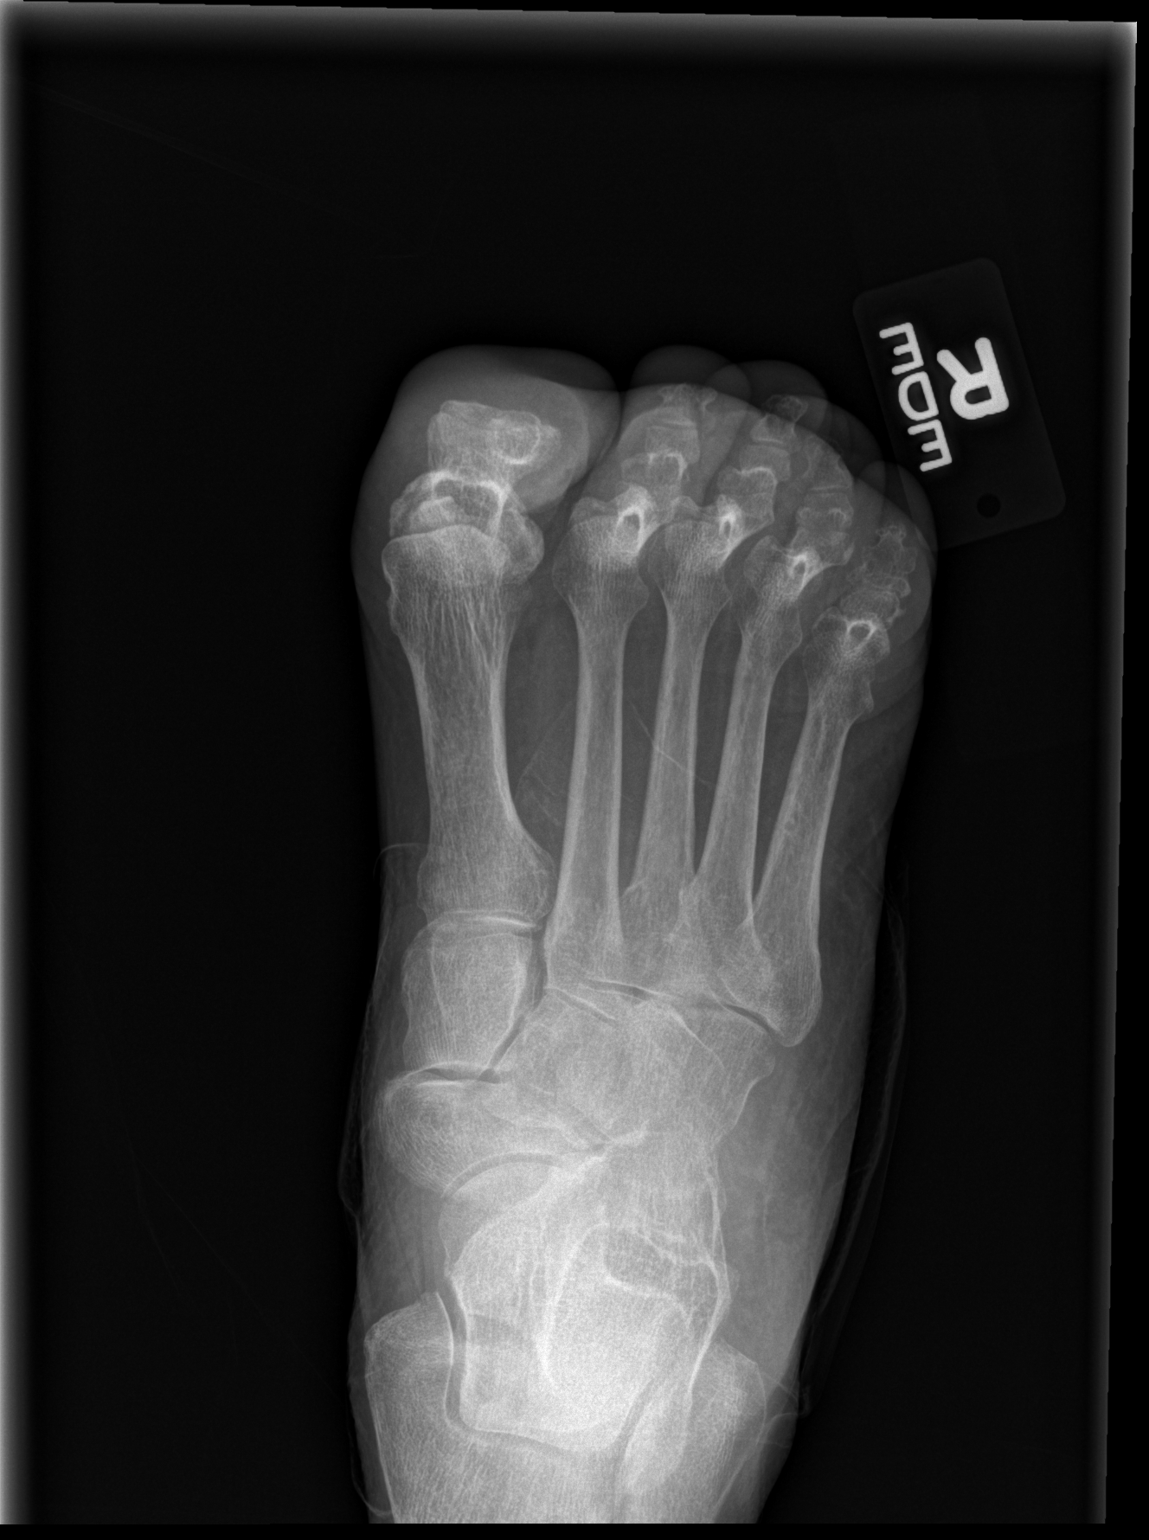

[2 of 2 positions shown; findings below may reference images not displayed]

FINDINGS: Two views of the right foot submitted. No acute fracture or
subluxation. Plantar spur of calcaneus. Mild dorsal spurring of
navicular. Diffuse osteopenia. No evidence of osteomyelitis.
IMPRESSION: No acute fracture or subluxation. No evidence of osteomyelitis.
Diffuse osteopenia. Plantar spur of calcaneus.
